# Patient Record
Sex: Female | Born: 1935 | Race: White | Hispanic: No | Smoking: Never smoker
Health system: Southern US, Community
[De-identification: ages and names within clinical notes are randomized; demographics above are authoritative.]

## PROBLEM LIST (undated history)

## (undated) DIAGNOSIS — I639 Cerebral infarction, unspecified: Secondary | ICD-10-CM

## (undated) DIAGNOSIS — I10 Essential (primary) hypertension: Secondary | ICD-10-CM

## (undated) DIAGNOSIS — M1990 Unspecified osteoarthritis, unspecified site: Secondary | ICD-10-CM

## (undated) DIAGNOSIS — I509 Heart failure, unspecified: Secondary | ICD-10-CM

## (undated) DIAGNOSIS — I519 Heart disease, unspecified: Secondary | ICD-10-CM

## (undated) DIAGNOSIS — C55 Malignant neoplasm of uterus, part unspecified: Secondary | ICD-10-CM

## (undated) DIAGNOSIS — E785 Hyperlipidemia, unspecified: Secondary | ICD-10-CM

## (undated) DIAGNOSIS — M549 Dorsalgia, unspecified: Secondary | ICD-10-CM

## (undated) DIAGNOSIS — N390 Urinary tract infection, site not specified: Secondary | ICD-10-CM

## (undated) HISTORY — DX: Urinary tract infection, site not specified: N39.0

## (undated) HISTORY — DX: Heart disease, unspecified: I51.9

## (undated) HISTORY — DX: Unspecified osteoarthritis, unspecified site: M19.90

## (undated) HISTORY — DX: Dorsalgia, unspecified: M54.9

## (undated) HISTORY — DX: Cerebral infarction, unspecified: I63.9

## (undated) HISTORY — DX: Malignant neoplasm of uterus, part unspecified: C55

## (undated) HISTORY — DX: Hyperlipidemia, unspecified: E78.5

## (undated) HISTORY — PX: TOTAL HIP ARTHROPLASTY: SHX124

## (undated) HISTORY — DX: Essential (primary) hypertension: I10

## (undated) HISTORY — DX: Heart failure, unspecified: I50.9

---
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
## 1970-04-26 HISTORY — PX: ABDOMINAL HYSTERECTOMY: SHX81

## 2004-05-25 ENCOUNTER — Ambulatory Visit: Payer: Self-pay | Admitting: Family Medicine

## 2005-07-16 ENCOUNTER — Ambulatory Visit: Payer: Self-pay | Admitting: Family Medicine

## 2006-07-20 ENCOUNTER — Ambulatory Visit: Payer: Self-pay | Admitting: Family Medicine

## 2007-06-20 ENCOUNTER — Ambulatory Visit: Payer: Self-pay | Admitting: Gastroenterology

## 2007-09-12 ENCOUNTER — Ambulatory Visit: Payer: Self-pay | Admitting: Family Medicine

## 2007-09-14 ENCOUNTER — Ambulatory Visit: Payer: Self-pay | Admitting: Family Medicine

## 2008-02-27 ENCOUNTER — Ambulatory Visit: Payer: Self-pay | Admitting: Ophthalmology

## 2008-03-11 ENCOUNTER — Ambulatory Visit: Payer: Self-pay | Admitting: Ophthalmology

## 2008-09-17 ENCOUNTER — Ambulatory Visit: Payer: Self-pay | Admitting: Family Medicine

## 2009-10-02 ENCOUNTER — Ambulatory Visit: Payer: Self-pay | Admitting: Family Medicine

## 2010-08-18 ENCOUNTER — Ambulatory Visit: Payer: Self-pay | Admitting: Ophthalmology

## 2010-08-31 ENCOUNTER — Ambulatory Visit: Payer: Self-pay | Admitting: Ophthalmology

## 2010-11-25 ENCOUNTER — Ambulatory Visit: Payer: Self-pay | Admitting: Family Medicine

## 2010-11-25 ENCOUNTER — Emergency Department: Payer: Self-pay | Admitting: Emergency Medicine

## 2010-12-16 ENCOUNTER — Ambulatory Visit: Payer: Self-pay | Admitting: Family Medicine

## 2010-12-23 ENCOUNTER — Encounter: Payer: Self-pay | Admitting: Nurse Practitioner

## 2010-12-23 ENCOUNTER — Encounter: Payer: Self-pay | Admitting: Cardiothoracic Surgery

## 2010-12-26 ENCOUNTER — Encounter: Payer: Self-pay | Admitting: Nurse Practitioner

## 2010-12-26 ENCOUNTER — Encounter: Payer: Self-pay | Admitting: Cardiothoracic Surgery

## 2011-01-25 ENCOUNTER — Encounter: Payer: Self-pay | Admitting: Cardiothoracic Surgery

## 2011-01-25 ENCOUNTER — Encounter: Payer: Self-pay | Admitting: Nurse Practitioner

## 2011-12-14 ENCOUNTER — Ambulatory Visit: Payer: Self-pay | Admitting: Family Medicine

## 2012-02-21 ENCOUNTER — Inpatient Hospital Stay: Payer: Self-pay | Admitting: Orthopedic Surgery

## 2012-02-21 LAB — BASIC METABOLIC PANEL
BUN: 21 mg/dL — ABNORMAL HIGH (ref 7–18)
Calcium, Total: 9.1 mg/dL (ref 8.5–10.1)
Chloride: 108 mmol/L — ABNORMAL HIGH (ref 98–107)
EGFR (Non-African Amer.): 51 — ABNORMAL LOW
Glucose: 116 mg/dL — ABNORMAL HIGH (ref 65–99)
Osmolality: 291 (ref 275–301)
Potassium: 4.1 mmol/L (ref 3.5–5.1)

## 2012-02-21 LAB — CBC
HCT: 31.9 % — ABNORMAL LOW (ref 35.0–47.0)
HGB: 10.5 g/dL — ABNORMAL LOW (ref 12.0–16.0)
MCH: 30 pg (ref 26.0–34.0)
MCHC: 33 g/dL (ref 32.0–36.0)
MCV: 91 fL (ref 80–100)
Platelet: 287 10*3/uL (ref 150–440)
RBC: 3.51 10*6/uL — ABNORMAL LOW (ref 3.80–5.20)
RDW: 14 % (ref 11.5–14.5)
WBC: 9 10*3/uL (ref 3.6–11.0)

## 2012-02-21 LAB — URINALYSIS, COMPLETE
Bilirubin,UR: NEGATIVE
Blood: NEGATIVE
Nitrite: NEGATIVE
Protein: NEGATIVE
RBC,UR: 3 /HPF (ref 0–5)
Specific Gravity: 1.017 (ref 1.003–1.030)
WBC UR: 52 /HPF (ref 0–5)

## 2012-02-21 LAB — APTT: Activated PTT: 29.2 secs (ref 23.6–35.9)

## 2012-02-21 LAB — IRON AND TIBC
Iron Bind.Cap.(Total): 297 ug/dL (ref 250–450)
Iron: 111 ug/dL (ref 50–170)
Unbound Iron-Bind.Cap.: 186 ug/dL

## 2012-02-21 LAB — FERRITIN: Ferritin (ARMC): 121 ng/mL (ref 8–388)

## 2012-02-21 LAB — PROTIME-INR
INR: 0.9
Prothrombin Time: 13 secs (ref 11.5–14.7)

## 2012-02-23 LAB — CBC WITH DIFFERENTIAL/PLATELET
Basophil #: 0 10*3/uL (ref 0.0–0.1)
Basophil %: 0.4 %
Eosinophil #: 0 10*3/uL (ref 0.0–0.7)
HGB: 7.7 g/dL — ABNORMAL LOW (ref 12.0–16.0)
Lymphocyte %: 10.1 %
MCHC: 35 g/dL (ref 32.0–36.0)
Monocyte %: 9 %
Neutrophil %: 80.5 %
RDW: 13.9 % (ref 11.5–14.5)

## 2012-02-23 LAB — BASIC METABOLIC PANEL
Calcium, Total: 7.9 mg/dL — ABNORMAL LOW (ref 8.5–10.1)
Chloride: 105 mmol/L (ref 98–107)
Co2: 26 mmol/L (ref 21–32)
Creatinine: 0.65 mg/dL (ref 0.60–1.30)
EGFR (African American): >60
Osmolality: 280 (ref 275–301)
Potassium: 3.7 mmol/L (ref 3.5–5.1)
Sodium: 139 mmol/L (ref 136–145)

## 2012-02-24 ENCOUNTER — Encounter: Payer: Self-pay | Admitting: Internal Medicine

## 2012-02-24 LAB — CBC WITH DIFFERENTIAL/PLATELET
Basophil #: 0.1 10*3/uL (ref 0.0–0.1)
Basophil #: 0.1 10*3/uL (ref 0.0–0.1)
Eosinophil #: 0.1 10*3/uL (ref 0.0–0.7)
Eosinophil %: 1.1 %
HCT: 24.1 % — ABNORMAL LOW (ref 35.0–47.0)
HGB: 6.7 g/dL — ABNORMAL LOW (ref 12.0–16.0)
Lymphocyte #: 1.7 10*3/uL (ref 1.0–3.6)
MCH: 29.4 pg (ref 26.0–34.0)
MCH: 31.3 pg (ref 26.0–34.0)
MCHC: 34.9 g/dL (ref 32.0–36.0)
MCV: 91 fL (ref 80–100)
MCV: 90 fL (ref 80–100)
Monocyte #: 1.1 x10 3/mm — ABNORMAL HIGH (ref 0.2–0.9)
Monocyte #: 1.1 x10 3/mm — ABNORMAL HIGH (ref 0.2–0.9)
Monocyte %: 13.4 %
Monocyte %: 11.2 %
Neutrophil #: 7.2 10*3/uL — ABNORMAL HIGH (ref 1.4–6.5)
Neutrophil %: 65.3 %
Platelet: 184 10*3/uL (ref 150–440)
Platelet: 196 10*3/uL (ref 150–440)
RBC: 2.26 10*6/uL — ABNORMAL LOW (ref 3.80–5.20)
RBC: 2.69 10*6/uL — ABNORMAL LOW (ref 3.80–5.20)
RDW: 14 % (ref 11.5–14.5)
WBC: 10 10*3/uL (ref 3.6–11.0)

## 2012-02-25 ENCOUNTER — Encounter: Payer: Self-pay | Admitting: Internal Medicine

## 2012-02-25 LAB — CBC WITH DIFFERENTIAL/PLATELET
Basophil #: 0.1 10*3/uL (ref 0.0–0.1)
Basophil %: 0.6 %
Eosinophil %: 2.2 %
HCT: 23.2 % — ABNORMAL LOW (ref 35.0–47.0)
HGB: 8.3 g/dL — ABNORMAL LOW (ref 12.0–16.0)
Lymphocyte %: 18.6 %
Monocyte %: 10.4 %
Neutrophil #: 6.3 10*3/uL (ref 1.4–6.5)
Neutrophil %: 68.2 %
RBC: 2.59 10*6/uL — ABNORMAL LOW (ref 3.80–5.20)
RDW: 13.8 % (ref 11.5–14.5)
WBC: 9.2 10*3/uL (ref 3.6–11.0)

## 2012-02-25 LAB — URINE CULTURE

## 2012-02-27 LAB — URINALYSIS, COMPLETE
Blood: NEGATIVE
Glucose,UR: NEGATIVE mg/dL (ref 0–75)
Ketone: NEGATIVE
Protein: NEGATIVE
RBC,UR: 1 /HPF (ref 0–5)
Specific Gravity: 1.006 (ref 1.003–1.030)
Squamous Epithelial: 1
Transitional Epi: 1
WBC UR: 12 /HPF (ref 0–5)

## 2012-03-06 ENCOUNTER — Ambulatory Visit: Payer: Self-pay | Admitting: Internal Medicine

## 2012-03-07 LAB — CBC WITH DIFFERENTIAL/PLATELET
Basophil %: 0.6 %
Eosinophil #: 0.2 10*3/uL (ref 0.0–0.7)
Eosinophil %: 2.9 %
HCT: 30.4 % — ABNORMAL LOW (ref 35.0–47.0)
HGB: 10 g/dL — ABNORMAL LOW (ref 12.0–16.0)
MCH: 30.5 pg (ref 26.0–34.0)
MCV: 93 fL (ref 80–100)
RBC: 3.29 10*6/uL — ABNORMAL LOW (ref 3.80–5.20)
WBC: 8.1 10*3/uL (ref 3.6–11.0)

## 2012-03-26 ENCOUNTER — Encounter: Payer: Self-pay | Admitting: Internal Medicine

## 2012-03-27 LAB — URINALYSIS, COMPLETE
Hyaline Cast: 1
Nitrite: POSITIVE
Protein: NEGATIVE
RBC,UR: 1 /HPF (ref 0–5)
Specific Gravity: 1.014 (ref 1.003–1.030)
WBC UR: 37 /HPF (ref 0–5)

## 2012-05-01 ENCOUNTER — Inpatient Hospital Stay: Payer: Self-pay | Admitting: Student

## 2012-05-01 LAB — BASIC METABOLIC PANEL
BUN: 15 mg/dL (ref 7–18)
Calcium, Total: 9.6 mg/dL (ref 8.5–10.1)
Chloride: 106 mmol/L (ref 98–107)
Creatinine: 0.71 mg/dL (ref 0.60–1.30)
EGFR (African American): >60
EGFR (Non-African Amer.): >60
Osmolality: 287 (ref 275–301)
Potassium: 3.7 mmol/L (ref 3.5–5.1)
Sodium: 144 mmol/L (ref 136–145)

## 2012-05-01 LAB — CBC
HCT: 33.2 % — ABNORMAL LOW (ref 35.0–47.0)
HGB: 10.8 g/dL — ABNORMAL LOW (ref 12.0–16.0)
MCH: 29.6 pg (ref 26.0–34.0)
RBC: 3.64 10*6/uL — ABNORMAL LOW (ref 3.80–5.20)
RDW: 15.3 % — ABNORMAL HIGH (ref 11.5–14.5)
WBC: 9.1 10*3/uL (ref 3.6–11.0)

## 2012-05-01 LAB — URINALYSIS, COMPLETE
Bacteria: NONE SEEN
Bilirubin,UR: NEGATIVE
Ketone: NEGATIVE
Ph: 7 (ref 4.5–8.0)
Protein: NEGATIVE
RBC,UR: NONE SEEN /HPF (ref 0–5)
Specific Gravity: 1.002 (ref 1.003–1.030)

## 2012-05-01 LAB — TROPONIN I
Troponin-I: 0.11 ng/mL — ABNORMAL HIGH
Troponin-I: 0.11 ng/mL — ABNORMAL HIGH

## 2012-05-01 LAB — CK TOTAL AND CKMB (NOT AT ARMC)
CK, Total: 67 U/L (ref 21–215)
CK-MB: 1 ng/mL (ref 0.5–3.6)

## 2012-05-02 LAB — CBC WITH DIFFERENTIAL/PLATELET
Basophil #: 0.1 10*3/uL (ref 0.0–0.1)
Basophil %: 1.1 %
Eosinophil #: 0.2 10*3/uL (ref 0.0–0.7)
HCT: 30.4 % — ABNORMAL LOW (ref 35.0–47.0)
Lymphocyte #: 2.3 10*3/uL (ref 1.0–3.6)
Lymphocyte %: 26.7 %
MCH: 29.4 pg (ref 26.0–34.0)
MCV: 90 fL (ref 80–100)
Monocyte #: 0.8 x10 3/mm (ref 0.2–0.9)
Monocyte %: 9.3 %
Neutrophil #: 5.1 10*3/uL (ref 1.4–6.5)
Neutrophil %: 60.5 %

## 2012-05-02 LAB — LIPID PANEL
HDL Cholesterol: 28 mg/dL — ABNORMAL LOW (ref 40–60)
Triglycerides: 106 mg/dL (ref 0–200)
VLDL Cholesterol, Calc: 21 mg/dL (ref 5–40)

## 2012-05-02 LAB — BASIC METABOLIC PANEL
Chloride: 106 mmol/L (ref 98–107)
Co2: 30 mmol/L (ref 21–32)
Creatinine: 0.82 mg/dL (ref 0.60–1.30)
EGFR (Non-African Amer.): >60
Glucose: 94 mg/dL (ref 65–99)
Potassium: 3.6 mmol/L (ref 3.5–5.1)

## 2012-05-02 LAB — MAGNESIUM: Magnesium: 2.1 mg/dL

## 2012-05-03 LAB — URINE CULTURE

## 2012-05-26 ENCOUNTER — Observation Stay: Payer: Self-pay | Admitting: Internal Medicine

## 2012-05-26 LAB — COMPREHENSIVE METABOLIC PANEL
Albumin: 4 g/dL (ref 3.4–5.0)
Alkaline Phosphatase: 76 U/L (ref 50–136)
Anion Gap: 7 (ref 7–16)
Co2: 27 mmol/L (ref 21–32)
Osmolality: 267 (ref 275–301)
Potassium: 4.4 mmol/L (ref 3.5–5.1)
Total Protein: 7.8 g/dL (ref 6.4–8.2)

## 2012-05-26 LAB — URINALYSIS, COMPLETE
Bacteria: NONE SEEN
Blood: NEGATIVE
Ketone: NEGATIVE
Nitrite: NEGATIVE
Specific Gravity: 1.005 (ref 1.003–1.030)
Squamous Epithelial: <1
WBC UR: 97 /HPF (ref 0–5)

## 2012-05-26 LAB — CBC
HGB: 11 g/dL — ABNORMAL LOW (ref 12.0–16.0)
MCH: 29.8 pg (ref 26.0–34.0)
MCHC: 33.6 g/dL (ref 32.0–36.0)
Platelet: 298 10*3/uL (ref 150–440)
RBC: 3.7 10*6/uL — ABNORMAL LOW (ref 3.80–5.20)
RDW: 14.4 % (ref 11.5–14.5)
WBC: 9.6 10*3/uL (ref 3.6–11.0)

## 2012-05-27 LAB — CBC WITH DIFFERENTIAL/PLATELET
Basophil #: 0.1 10*3/uL (ref 0.0–0.1)
Lymphocyte #: 1.4 10*3/uL (ref 1.0–3.6)
MCH: 30 pg (ref 26.0–34.0)
MCHC: 33.8 g/dL (ref 32.0–36.0)
MCV: 89 fL (ref 80–100)
Monocyte #: 0.6 x10 3/mm (ref 0.2–0.9)
Monocyte %: 10.3 %
Neutrophil %: 60.3 %
Platelet: 235 10*3/uL (ref 150–440)
RBC: 3.06 10*6/uL — ABNORMAL LOW (ref 3.80–5.20)
RDW: 14.3 % (ref 11.5–14.5)
WBC: 5.8 10*3/uL (ref 3.6–11.0)

## 2012-05-27 LAB — BASIC METABOLIC PANEL
Anion Gap: 7 (ref 7–16)
Chloride: 107 mmol/L (ref 98–107)
Co2: 25 mmol/L (ref 21–32)
Creatinine: 0.88 mg/dL (ref 0.60–1.30)
EGFR (Non-African Amer.): >60
Glucose: 93 mg/dL (ref 65–99)
Osmolality: 279 (ref 275–301)
Potassium: 4.1 mmol/L (ref 3.5–5.1)

## 2012-05-27 LAB — TROPONIN I: Troponin-I: 0.04 ng/mL

## 2012-06-01 ENCOUNTER — Observation Stay: Payer: Self-pay | Admitting: Internal Medicine

## 2012-06-01 LAB — COMPREHENSIVE METABOLIC PANEL
Albumin: 3.7 g/dL (ref 3.4–5.0)
Alkaline Phosphatase: 70 U/L (ref 50–136)
Anion Gap: 5 — ABNORMAL LOW (ref 7–16)
BUN: 15 mg/dL (ref 7–18)
Chloride: 100 mmol/L (ref 98–107)
Creatinine: 0.81 mg/dL (ref 0.60–1.30)
EGFR (Non-African Amer.): >60
SGOT(AST): 34 U/L (ref 15–37)

## 2012-06-01 LAB — CBC
HGB: 10.5 g/dL — ABNORMAL LOW (ref 12.0–16.0)
MCHC: 33.6 g/dL (ref 32.0–36.0)
Platelet: 279 10*3/uL (ref 150–440)
RBC: 3.49 10*6/uL — ABNORMAL LOW (ref 3.80–5.20)
RDW: 14.1 % (ref 11.5–14.5)

## 2012-06-01 LAB — CK TOTAL AND CKMB (NOT AT ARMC)
CK, Total: 70 U/L (ref 21–215)
CK, Total: 58 U/L (ref 21–215)
CK-MB: 1.2 ng/mL (ref 0.5–3.6)
CK-MB: <0.5 ng/mL — ABNORMAL LOW (ref 0.5–3.6)

## 2012-06-01 LAB — PROTIME-INR
INR: 0.9
Prothrombin Time: 12.6 secs (ref 11.5–14.7)

## 2012-06-01 LAB — TSH: Thyroid Stimulating Horm: 3.1 u[IU]/mL

## 2012-06-01 LAB — TROPONIN I: Troponin-I: 0.03 ng/mL

## 2012-06-02 LAB — CK TOTAL AND CKMB (NOT AT ARMC): CK, Total: 51 U/L (ref 21–215)

## 2012-06-02 LAB — BASIC METABOLIC PANEL
Anion Gap: 7 (ref 7–16)
Chloride: 102 mmol/L (ref 98–107)
Co2: 27 mmol/L (ref 21–32)
Creatinine: 0.98 mg/dL (ref 0.60–1.30)
EGFR (African American): >60
EGFR (Non-African Amer.): 53 — ABNORMAL LOW
Osmolality: 274 (ref 275–301)
Potassium: 4 mmol/L (ref 3.5–5.1)

## 2012-06-02 LAB — TROPONIN I: Troponin-I: 0.03 ng/mL

## 2012-07-05 DIAGNOSIS — I519 Heart disease, unspecified: Secondary | ICD-10-CM | POA: Insufficient documentation

## 2012-07-05 DIAGNOSIS — S72001A Fracture of unspecified part of neck of right femur, initial encounter for closed fracture: Secondary | ICD-10-CM | POA: Insufficient documentation

## 2012-07-05 DIAGNOSIS — Z9049 Acquired absence of other specified parts of digestive tract: Secondary | ICD-10-CM | POA: Insufficient documentation

## 2012-07-05 DIAGNOSIS — Z90710 Acquired absence of both cervix and uterus: Secondary | ICD-10-CM | POA: Insufficient documentation

## 2012-07-05 DIAGNOSIS — G939 Disorder of brain, unspecified: Secondary | ICD-10-CM | POA: Insufficient documentation

## 2012-07-05 DIAGNOSIS — M1990 Unspecified osteoarthritis, unspecified site: Secondary | ICD-10-CM | POA: Insufficient documentation

## 2012-08-18 DIAGNOSIS — N3020 Other chronic cystitis without hematuria: Secondary | ICD-10-CM | POA: Insufficient documentation

## 2012-09-12 DIAGNOSIS — I10 Essential (primary) hypertension: Secondary | ICD-10-CM | POA: Insufficient documentation

## 2012-09-12 DIAGNOSIS — R55 Syncope and collapse: Secondary | ICD-10-CM | POA: Insufficient documentation

## 2012-09-22 ENCOUNTER — Emergency Department: Payer: Self-pay | Admitting: Emergency Medicine

## 2012-09-24 DIAGNOSIS — R339 Retention of urine, unspecified: Secondary | ICD-10-CM | POA: Insufficient documentation

## 2012-09-24 DIAGNOSIS — N390 Urinary tract infection, site not specified: Secondary | ICD-10-CM | POA: Insufficient documentation

## 2012-09-24 DIAGNOSIS — C539 Malignant neoplasm of cervix uteri, unspecified: Secondary | ICD-10-CM | POA: Insufficient documentation

## 2012-10-02 DIAGNOSIS — Z87440 Personal history of urinary (tract) infections: Secondary | ICD-10-CM | POA: Insufficient documentation

## 2012-10-02 DIAGNOSIS — K5710 Diverticulosis of small intestine without perforation or abscess without bleeding: Secondary | ICD-10-CM | POA: Insufficient documentation

## 2012-12-22 ENCOUNTER — Emergency Department: Payer: Self-pay | Admitting: Emergency Medicine

## 2012-12-22 LAB — COMPREHENSIVE METABOLIC PANEL
Alkaline Phosphatase: 77 U/L (ref 50–136)
Anion Gap: 7 (ref 7–16)
BUN: 16 mg/dL (ref 7–18)
Chloride: 91 mmol/L — ABNORMAL LOW (ref 98–107)
Co2: 26 mmol/L (ref 21–32)
Creatinine: 0.91 mg/dL (ref 0.60–1.30)
Glucose: 126 mg/dL — ABNORMAL HIGH (ref 65–99)
Osmolality: 252 (ref 275–301)
Potassium: 4.1 mmol/L (ref 3.5–5.1)
SGOT(AST): 28 U/L (ref 15–37)
SGPT (ALT): 22 U/L (ref 12–78)
Sodium: 124 mmol/L — ABNORMAL LOW (ref 136–145)

## 2012-12-22 LAB — URINALYSIS, COMPLETE
Bacteria: NONE SEEN
Glucose,UR: NEGATIVE mg/dL (ref 0–75)
Ketone: NEGATIVE
Nitrite: NEGATIVE
Ph: 6 (ref 4.5–8.0)
Protein: NEGATIVE

## 2012-12-22 LAB — CBC
HCT: 31.6 % — ABNORMAL LOW (ref 35.0–47.0)
MCV: 88 fL (ref 80–100)
WBC: 7.1 10*3/uL (ref 3.6–11.0)

## 2013-01-11 ENCOUNTER — Emergency Department: Payer: Self-pay | Admitting: Emergency Medicine

## 2013-01-11 LAB — COMPREHENSIVE METABOLIC PANEL
Alkaline Phosphatase: 80 U/L (ref 50–136)
BUN: 22 mg/dL — ABNORMAL HIGH (ref 7–18)
Calcium, Total: 9.7 mg/dL (ref 8.5–10.1)
Chloride: 100 mmol/L (ref 98–107)
Creatinine: 0.92 mg/dL (ref 0.60–1.30)
EGFR (Non-African Amer.): 57 — ABNORMAL LOW
Osmolality: 270 (ref 275–301)
SGPT (ALT): 24 U/L (ref 12–78)
Total Protein: 8.1 g/dL (ref 6.4–8.2)

## 2013-01-11 LAB — CBC
HCT: 31.8 % — ABNORMAL LOW (ref 35.0–47.0)
HGB: 10.8 g/dL — ABNORMAL LOW (ref 12.0–16.0)
MCH: 30.2 pg (ref 26.0–34.0)
MCV: 89 fL (ref 80–100)
RBC: 3.58 10*6/uL — ABNORMAL LOW (ref 3.80–5.20)
WBC: 8.1 10*3/uL (ref 3.6–11.0)

## 2013-01-11 LAB — URINALYSIS, COMPLETE
Blood: NEGATIVE
Glucose,UR: NEGATIVE mg/dL (ref 0–75)
Nitrite: NEGATIVE
Ph: 7 (ref 4.5–8.0)
Specific Gravity: 1.005 (ref 1.003–1.030)
WBC UR: 6 /HPF (ref 0–5)

## 2013-02-05 ENCOUNTER — Ambulatory Visit: Payer: Self-pay | Admitting: Family Medicine

## 2013-09-06 ENCOUNTER — Emergency Department: Payer: Self-pay | Admitting: Emergency Medicine

## 2013-09-06 LAB — BASIC METABOLIC PANEL
Anion Gap: 6 — ABNORMAL LOW (ref 7–16)
BUN: 18 mg/dL (ref 7–18)
CHLORIDE: 97 mmol/L — AB (ref 98–107)
Calcium, Total: 9.8 mg/dL (ref 8.5–10.1)
Co2: 29 mmol/L (ref 21–32)
Creatinine: 0.73 mg/dL (ref 0.60–1.30)
EGFR (African American): >60
EGFR (Non-African Amer.): >60
Glucose: 98 mg/dL (ref 65–99)
OSMOLALITY: 266 (ref 275–301)
Potassium: 4.1 mmol/L (ref 3.5–5.1)
Sodium: 132 mmol/L — ABNORMAL LOW (ref 136–145)

## 2013-09-06 LAB — CBC
HCT: 33.7 % — ABNORMAL LOW (ref 35.0–47.0)
HGB: 11.1 g/dL — ABNORMAL LOW (ref 12.0–16.0)
MCH: 30.2 pg (ref 26.0–34.0)
MCHC: 32.9 g/dL (ref 32.0–36.0)
MCV: 92 fL (ref 80–100)
Platelet: 334 10*3/uL (ref 150–440)
RBC: 3.67 10*6/uL — ABNORMAL LOW (ref 3.80–5.20)
RDW: 13.6 % (ref 11.5–14.5)
WBC: 6.7 10*3/uL (ref 3.6–11.0)

## 2013-09-06 LAB — TROPONIN I
TROPONIN-I: 0.04 ng/mL
Troponin-I: 0.04 ng/mL

## 2013-09-06 LAB — PRO B NATRIURETIC PEPTIDE: B-Type Natriuretic Peptide: 366 pg/mL (ref 0–450)

## 2013-11-01 DIAGNOSIS — R2681 Unsteadiness on feet: Secondary | ICD-10-CM | POA: Insufficient documentation

## 2013-12-06 DIAGNOSIS — R262 Difficulty in walking, not elsewhere classified: Secondary | ICD-10-CM | POA: Insufficient documentation

## 2013-12-06 DIAGNOSIS — R2689 Other abnormalities of gait and mobility: Secondary | ICD-10-CM | POA: Insufficient documentation

## 2013-12-06 DIAGNOSIS — G723 Periodic paralysis: Secondary | ICD-10-CM | POA: Insufficient documentation

## 2013-12-17 ENCOUNTER — Ambulatory Visit: Payer: Self-pay | Admitting: Neurology

## 2013-12-24 DIAGNOSIS — I5032 Chronic diastolic (congestive) heart failure: Secondary | ICD-10-CM | POA: Insufficient documentation

## 2013-12-27 DIAGNOSIS — D352 Benign neoplasm of pituitary gland: Secondary | ICD-10-CM | POA: Insufficient documentation

## 2014-03-13 ENCOUNTER — Ambulatory Visit: Payer: Self-pay | Admitting: Family Medicine

## 2014-03-13 DIAGNOSIS — R410 Disorientation, unspecified: Secondary | ICD-10-CM | POA: Insufficient documentation

## 2014-03-20 ENCOUNTER — Ambulatory Visit: Payer: Self-pay | Admitting: Family Medicine

## 2014-03-22 IMAGING — US US CAROTID DUPLEX BILAT
1 series · 13 of 24 positions shown · non-contrast
Comparison: None

REASON FOR EXAM: TIA
COMMENTS:

PROCEDURE:     US  - US CAROTID DOPPLER BILATERAL  - May 26, 2012  [DATE]
RESULT:     Indication: TIA
TECHNIQUE: Gray-scale, color Doppler, and spectral Doppler images were
obtained of the extracranial carotid artery systems and vertebral arteries
in the neck.

[Series 1: us carotid duplex bilat · 0.07mm/px · 13 of 63 slices shown]
[im 1/63]
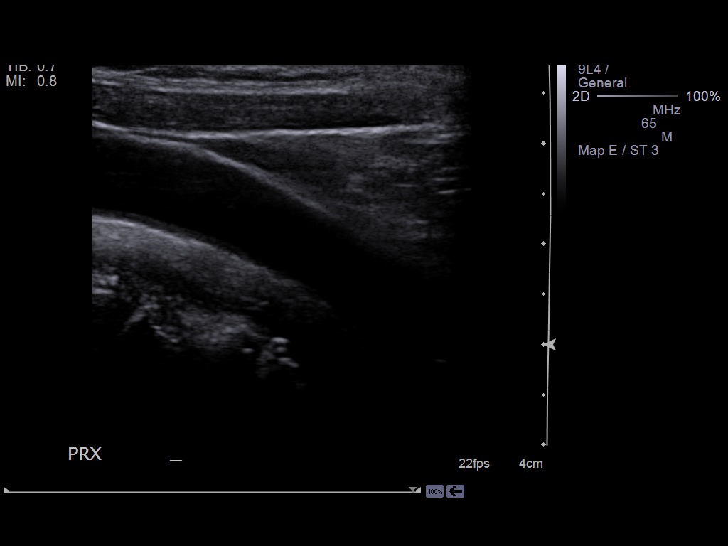
[im 6/63]
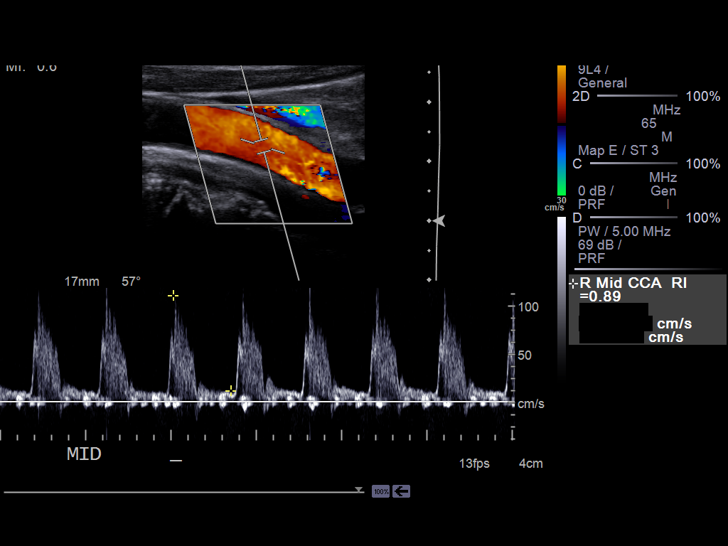
[im 11/63]
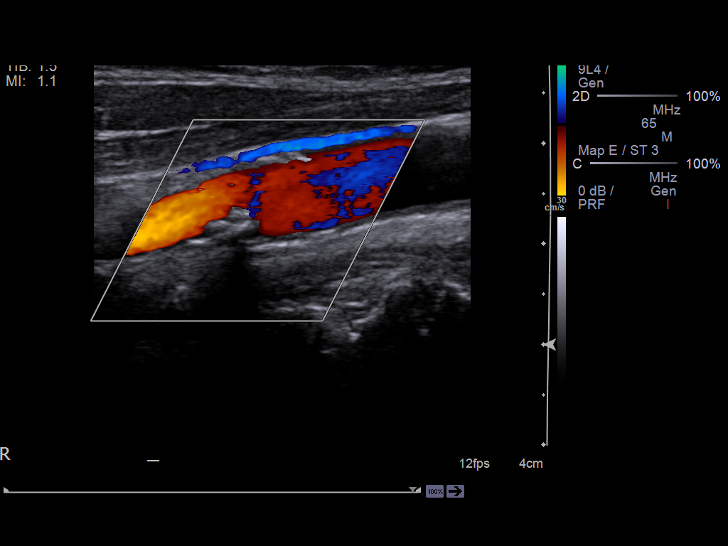
[im 17/63]
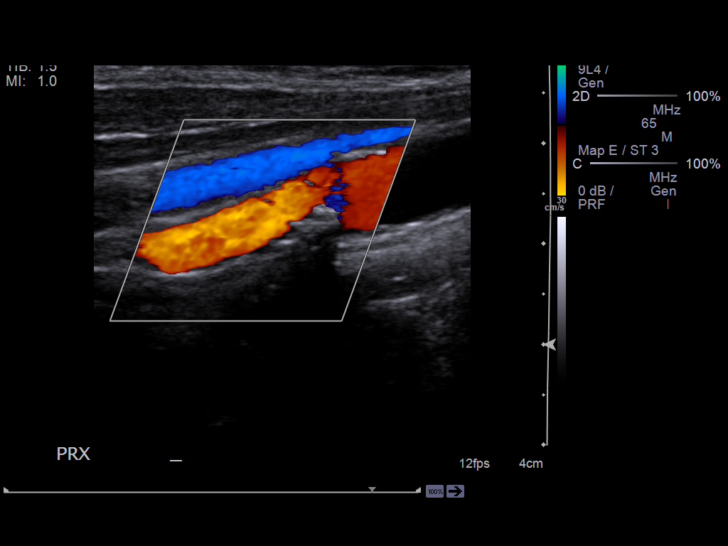
[im 22/63]
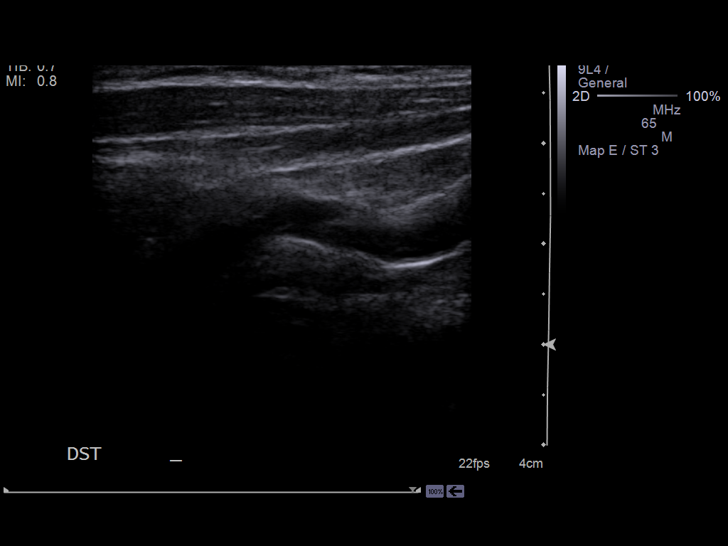
[im 27/63]
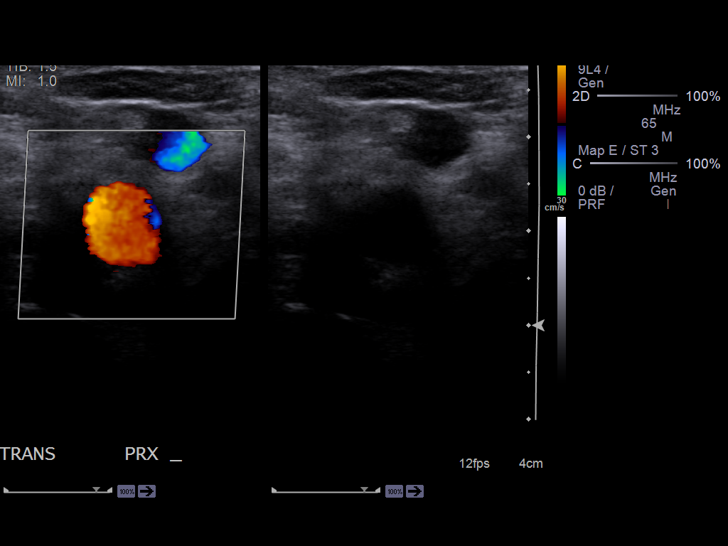
[im 33/63]
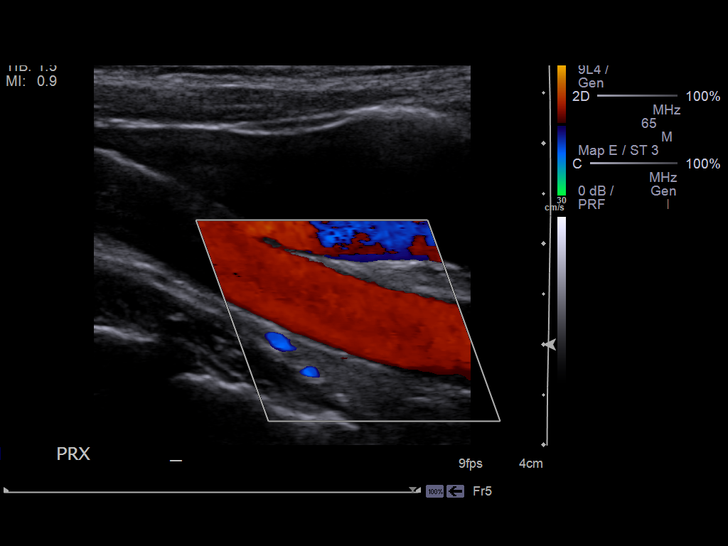
[im 36/63]
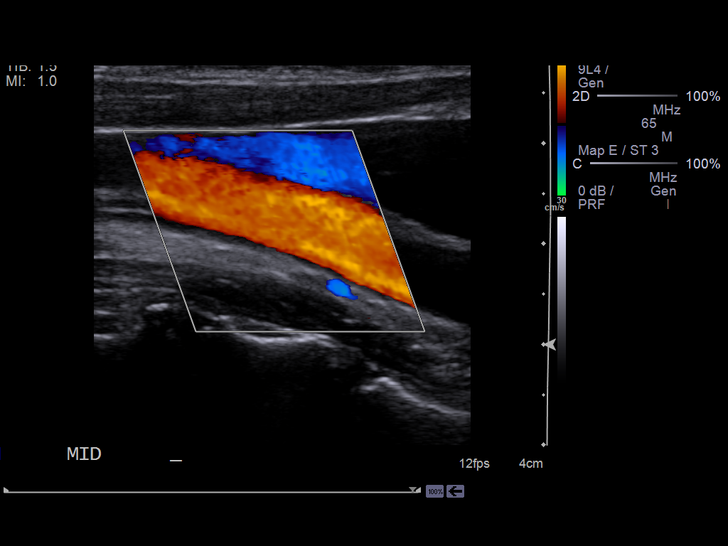
[im 41/63]
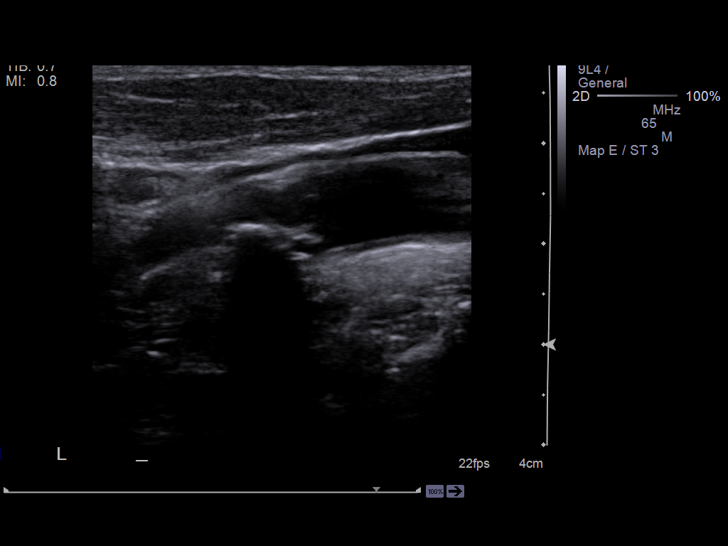
[im 46/63]
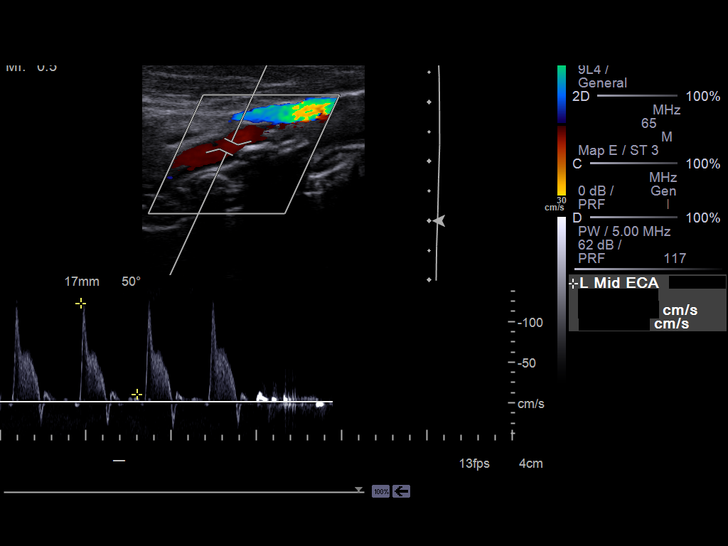
[im 52/63]
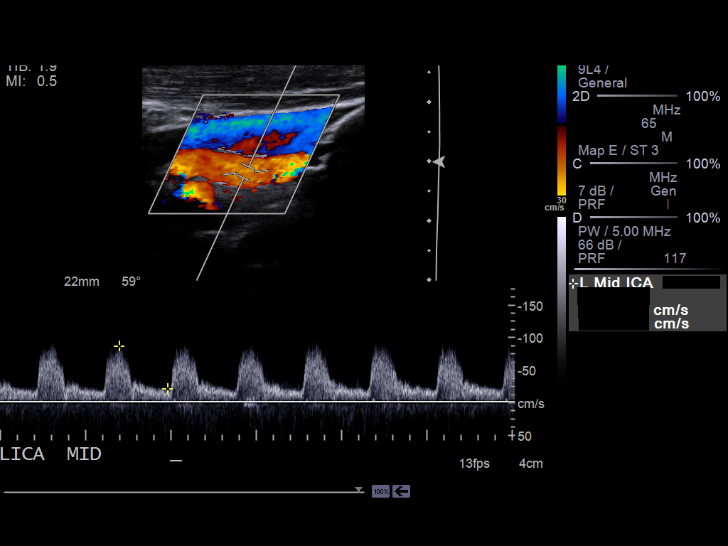
[im 57/63]
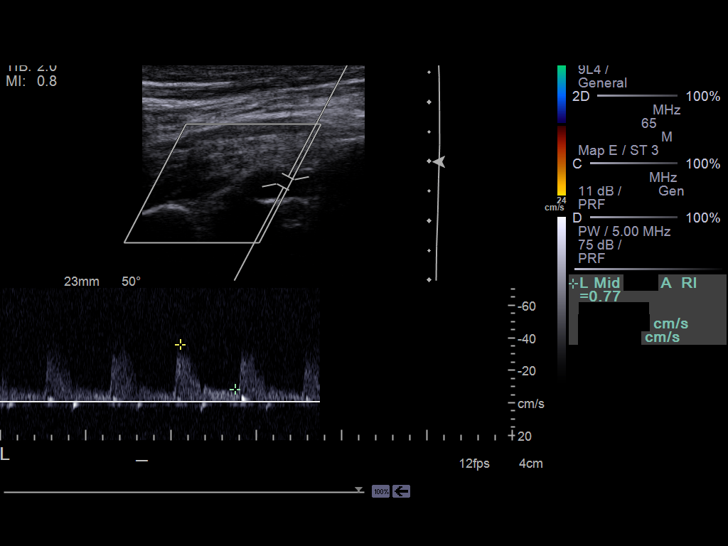
[im 63/63]
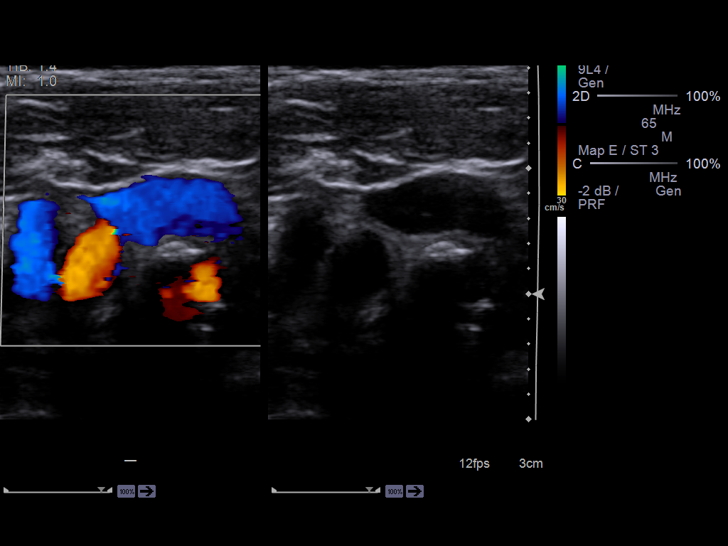

[13 of 24 positions shown; findings below may reference images not displayed]

FINDINGS: On the right, there is complex atherosclerotic plaque within the carotid
bulb and proximal ICA. Maximum peak systolic velocity in the right CCA is
111 cm/second. Maximum peak systolic velocity in the right ICA is 114
cm/second. Maximum peak systolic velocity in the right ECA is 120 cm/second.
The right ICA/CCA ratio is 1.02. This corresponds to a stenosis of less than
50 %. Antegrade blood flow is documented in the right vertebral artery.

On the left, there is complex atherosclerotic plaque within the carotid bulb
and proximal ICA. Maximum peak systolic velocity in the left CCA is 87
cm/second. Maximum peak systolic velocity in the left ICA is 98 cm/second.
Maximum peak systolic velocity in the left ECA is 122 cm/second. The left
ICA/CCA ratio is 1.12. This corresponds to a stenosis of less than 50 %.
Antegrade blood flow is documented in the left vertebral artery.
IMPRESSION: 1. No hemodynamically significant carotid artery stenosis.

[REDACTED]

## 2014-03-22 IMAGING — CR DG CHEST 1V PORT
1 series · 1 of 1 positions shown · non-contrast
Comparison: none

REASON FOR EXAM: CVA
COMMENTS:

[ap]
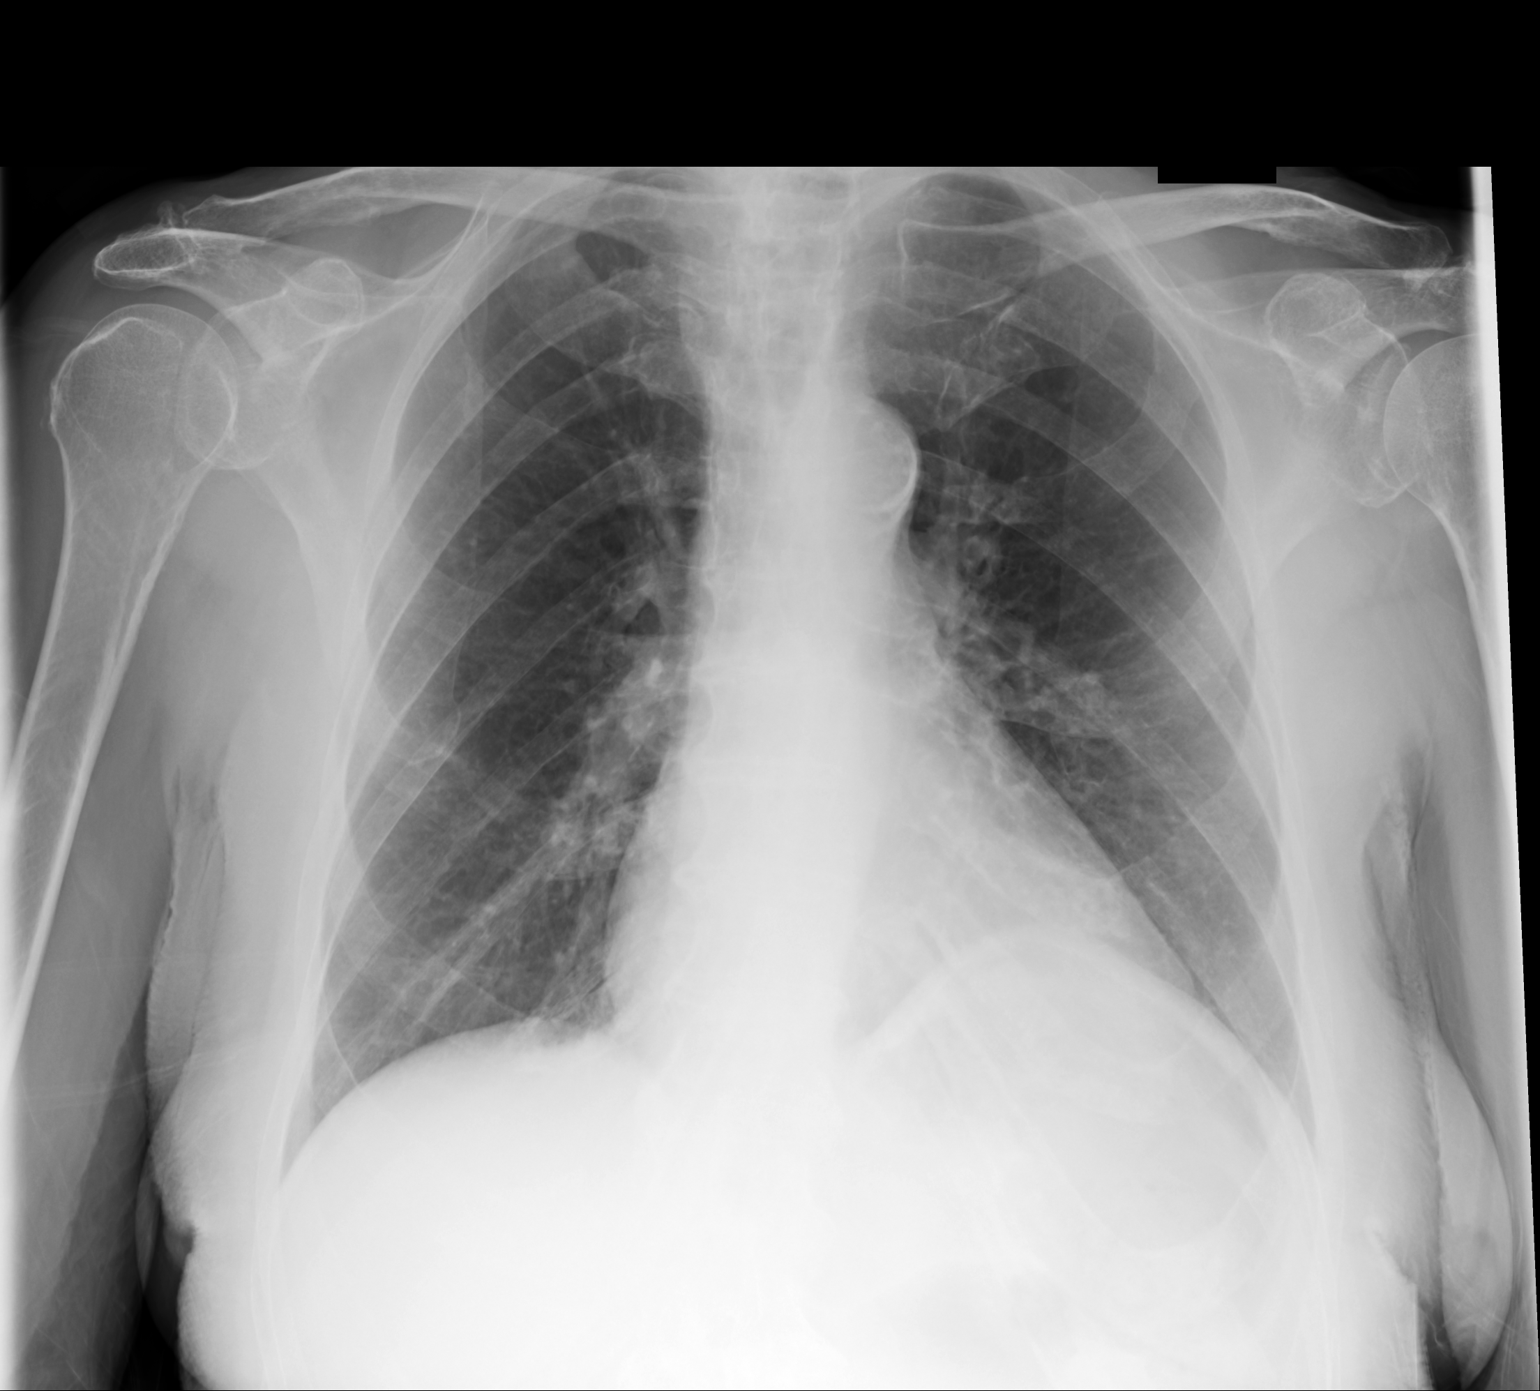

[1 of 1 positions shown; findings below may reference images not displayed]

PROCEDURE:     DXR - DXR PORTABLE CHEST SINGLE VIEW  - May 26, 2012  [DATE]

RESULT:     Comparison is made to study May 01, 2012.

The lungs are well-expanded. The cardiac silhouette is top normal in size.
The pulmonary vascularity is not engorged. There is no pleural effusion. The
mediastinum is normal in width. The bony thorax exhibits no acute
abnormality.
IMPRESSION: There is no evidence of acute cardiopulmonary abnormality.

[REDACTED]

## 2014-03-22 IMAGING — CT CT HEAD WITHOUT CONTRAST
1 series · 15 of 30 positions shown, 19 images · non-contrast
Comparison: none

REASON FOR EXAM: ams
COMMENTS:

[Series 2: soft tissue · axial · 0.45mm/px · z∈[-159,-19]mm · 15 of 32 slices shown, 19 images]
[im 2/32  brain]
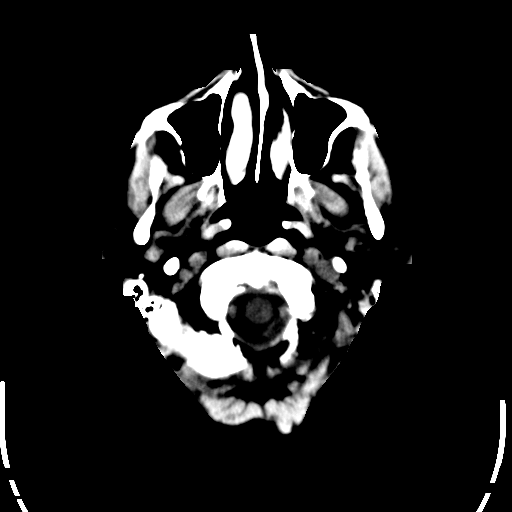
[im 2/32  bone]
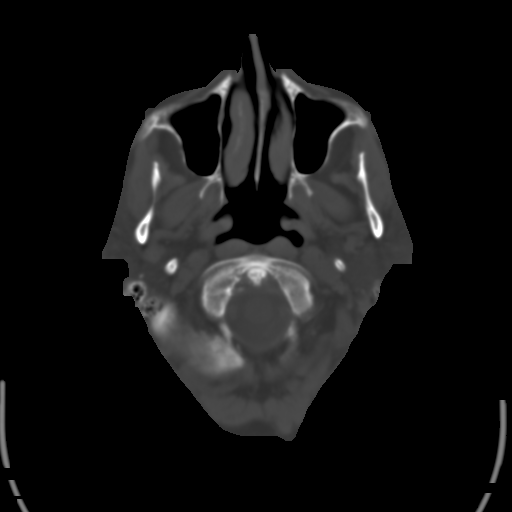
[im 4/32  brain]
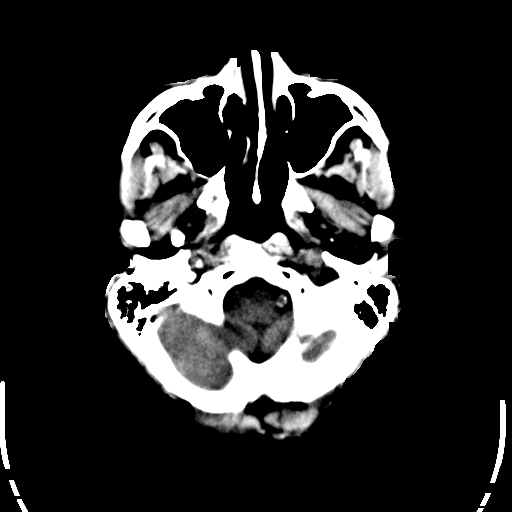
[im 6/32  brain]
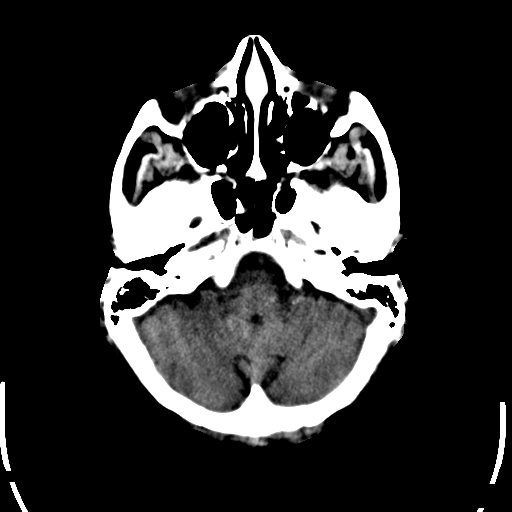
[im 8/32  brain]
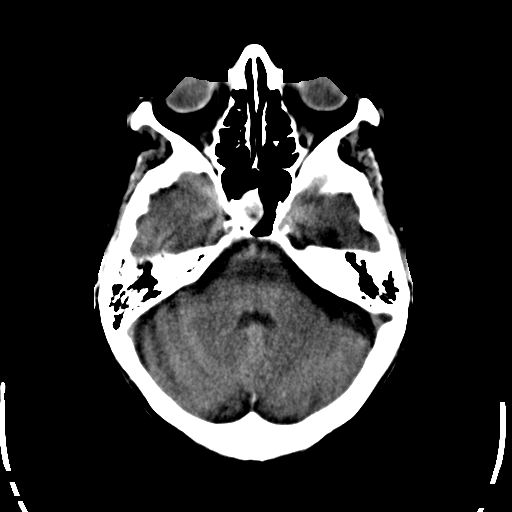
[im 10/32  brain]
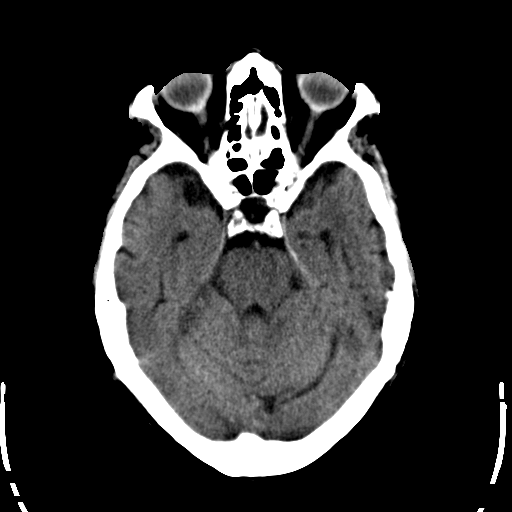
[im 10/32  bone]
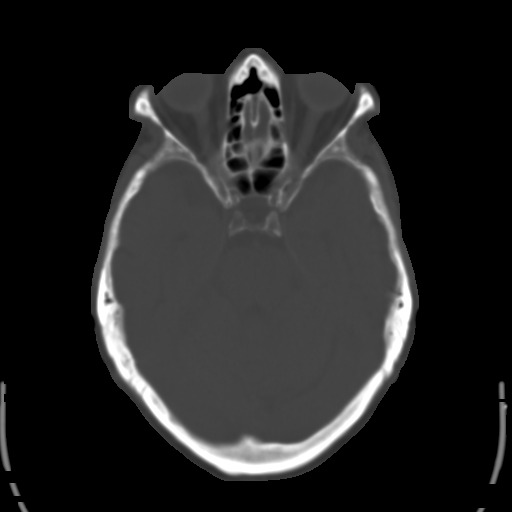
[im 12/32  brain]
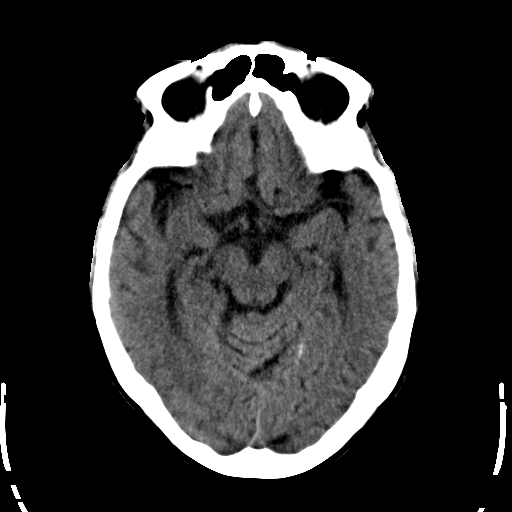
[im 14/32  brain]
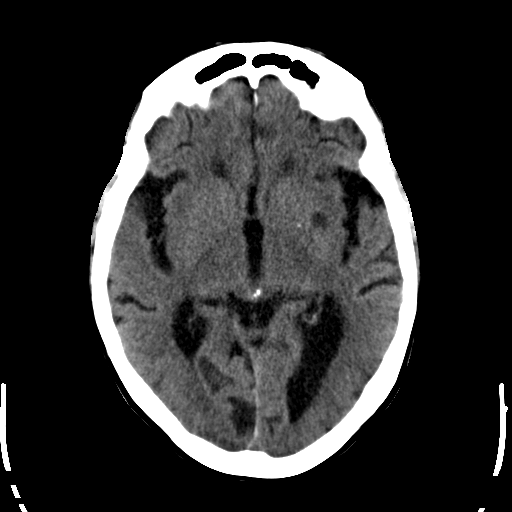
[im 17/32  brain]
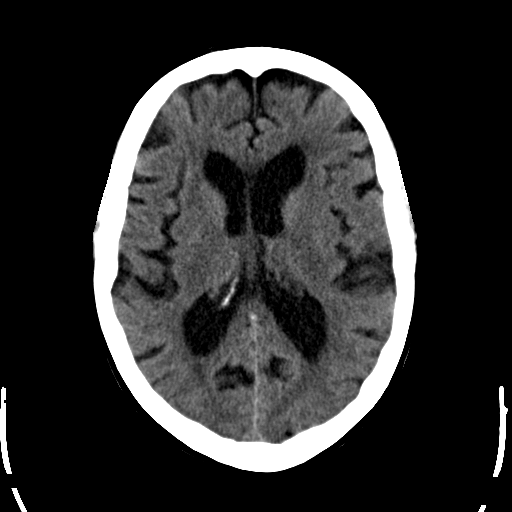
[im 18/32  brain]
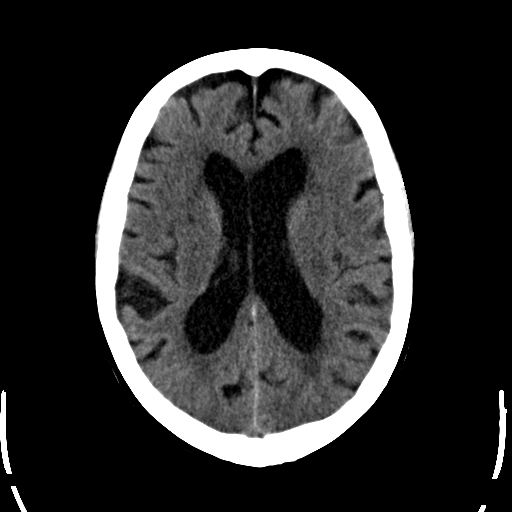
[im 18/32  bone]
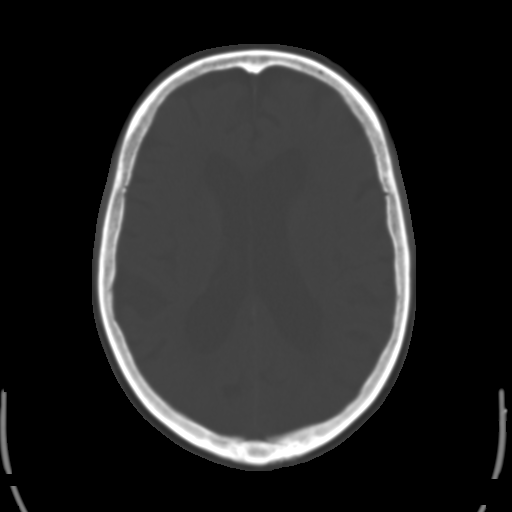
[im 20/32  brain]
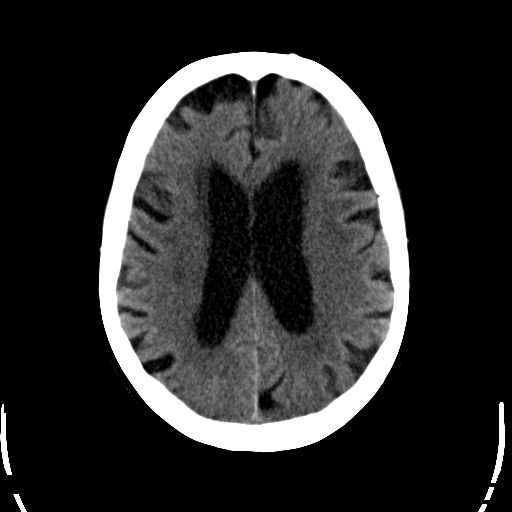
[im 22/32  brain]
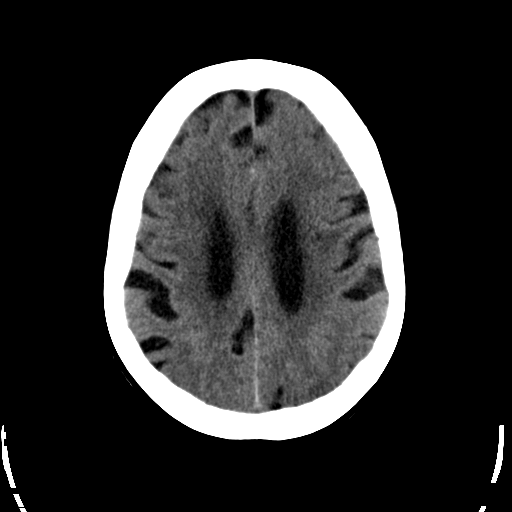
[im 24/32  brain]
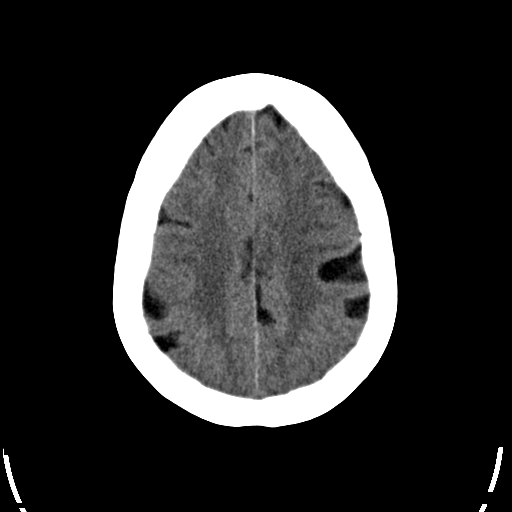
[im 26/32  brain]
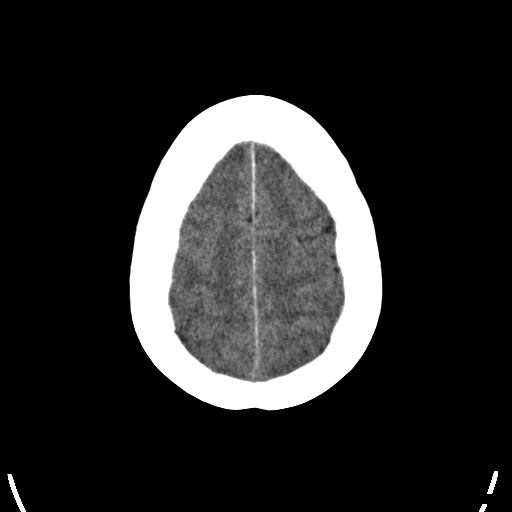
[im 26/32  bone]
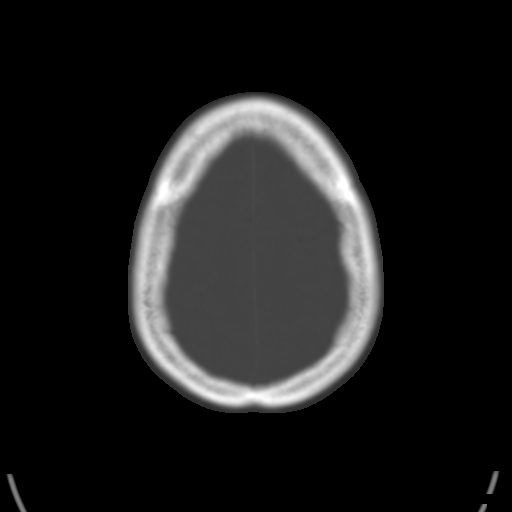
[im 28/32  brain]
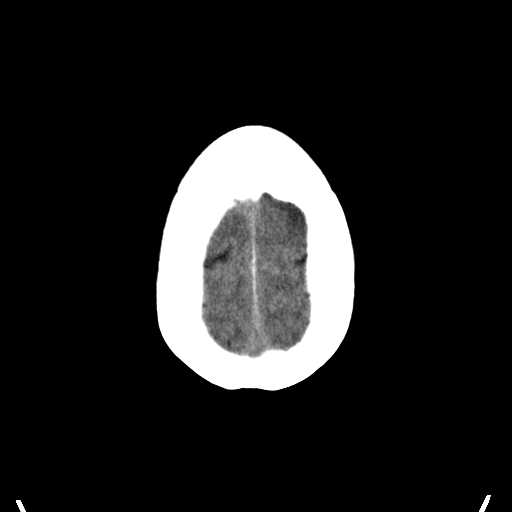
[im 30/32  brain]
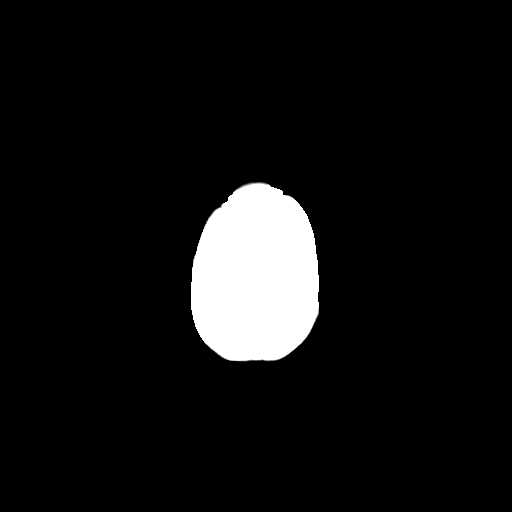

[15 of 30 positions shown; findings below may reference images not displayed]

PROCEDURE:     CT  - CT HEAD WITHOUT CONTRAST  - May 26, 2012  [DATE]

RESULT:     Noncontrast CT of the brain is compared to 01 May, 2012.

There is prominence of the ventricles and sulci. There is low-attenuation in
the periventricular and subcortical white matter. There is no evidence of
intracranial hemorrhage, mass effect or midline shift. No evolving infarct
is evident. The included mastoid air cells and paranasal sinuses show normal
appearing aeration except for a persistent area of soft tissue density along
the bony septum between the left and right sphenoid sinus which has a soft
tissue attenuation and measures up to approximately 1.75 cm. The orbits
appear within normal limits. No depressed skull fracture or destructive or
sclerotic bony masses appreciated. MRI followup may be beneficial if there
is concern for acute ischemic event. Stable low-attenuation in the basal
ganglia region consistent with lacunar infarcts.
IMPRESSION: 1. Atrophy with chronic microvascular ischemic disease. No acute
intracranial abnormality evident. Stable density in the sphenoid sinus
region as described.

[REDACTED]

## 2014-04-26 HISTORY — PX: SKIN CANCER DESTRUCTION: SHX778

## 2014-04-26 HISTORY — PX: BACK SURGERY: SHX140

## 2014-05-15 DIAGNOSIS — I10 Essential (primary) hypertension: Secondary | ICD-10-CM | POA: Insufficient documentation

## 2014-05-15 DIAGNOSIS — E785 Hyperlipidemia, unspecified: Secondary | ICD-10-CM | POA: Insufficient documentation

## 2014-07-04 DIAGNOSIS — N390 Urinary tract infection, site not specified: Secondary | ICD-10-CM | POA: Insufficient documentation

## 2014-08-13 NOTE — H&P (Signed)
    Subjective/Chief Complaint Right hip pain after a fall    History of Present Illness Patient is an 81 year old woman with history of a fall   Past Med/Surgical Hx:  Cervical cancer:   Hypertension:   Hard of Hearing:   Appendectomy:   Cataract Extraction:   Hysterectomy:   ALLERGIES:  No Known Allergies:   Family and Social History:   Family History Non-Contributory     Assessment/Admission Diagnosis Right hip basicervical fracture    Plan Intertrochanteric nail fixation of the hip tomorrow. NPO after midnight.   Electronic Signatures for Addendum Section:  Dawayne Patricia (MD) (Signed Addendum 28-Oct-13 17:10)  Full H&P dictated.   Electronic Signatures: Dawayne Patricia (MD)  (Signed 28-Oct-13 15:51)  Authored: CHIEF COMPLAINT and HISTORY, PAST MEDICAL/SURGIAL HISTORY, ALLERGIES, HOME MEDICATIONS, FAMILY AND SOCIAL HISTORY, ASSESSMENT AND PLAN   Last Updated: 28-Oct-13 17:10 by Dawayne Patricia (MD)
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
## 2014-08-13 NOTE — Op Note (Signed)
PATIENT NAME:  BRELYN, WOEHL MR#:  [id number] DATE OF BIRTH:  06/22/1926  DATE OF PROCEDURE:  02/22/2012  ADDENDUM:  Please note that the patient's right lower extremity was marked in the preoperative holding area.  Consent was reviewed and questions were answered.  A surgical time-out confirming surgical site, extremity, and procedure as well as confirming that the patient's radiographs were visible was performed prior to incision.   ____________________________ Dawayne Patricia, MD sr:bjt D: 02/25/2012 14:54:41 ET T: 02/25/2012 15:53:58 ET JOB#: [id number]  cc: Dawayne Patricia, MD, <Dictator> Dawayne Patricia MD ELECTRONICALLY SIGNED 02/28/2012 15:38

## 2014-08-13 NOTE — H&P (Signed)
PATIENT NAME:  Christine Mclean, Christine Mclean MR#:  [id number] DATE OF BIRTH:  31-Jul-1926  DATE OF ADMISSION:  02/21/2012  CHIEF COMPLAINT: Pain in the right hip after a fall.  HISTORY OF PRESENT ILLNESS: Christine Mclean is an 81 year old female who lives independently at home. She was doing some laundry and slipped in her laundry room.  She states she did not lose consciousness. She feels that she slipped because she was wearing socks without shoes.    The patient does not use any assistive device for ambulation on a normal basis. She notes significant pain in her right hip.  She denies numbness or tingling. She denies other injury.   PAST MEDICAL HISTORY / PAST SURGICAL HISTORY:  1. Cervical cancer. 2. Hypertension. 3. Hearing deficit. 4. Appendectomy. 5. Cataract extraction. 6. Hysterectomy.   DRUG ALLERGIES: No known drug allergies.   MEDICATIONS:  1. Simvastatin. 2. Lisinopril. 3. Ibuprofen.  FAMILY HISTORY: Noncontributory.  REVIEW OF SYSTEMS: A 12-point review of systems was done. Negative except as noted in the History of Present Illness.      PHYSICAL EXAMINATION:  GENERAL: Awake, alert, and oriented x3. In no acute distress. In significant distress with any motion of her bed.   HEENT: Pupils equal and reactive to light. Trachea is midline. Partial hearing loss.   CARDIOVASCULAR: Regular rate and rhythm. No murmurs or bruits appreciated.   PULMONARY: Lungs are clear to auscultation bilaterally. There is slight decrease in breath sounds at bilateral lung bases.  ABDOMEN: Soft, nontender, nondistended. Normal bowel sounds.   EXTREMITIES: Right lower extremity was examined. Extremity is approximately 1.5 cm shortened and externally rotated. Foot is cool to touch and equal in temperature to the opposite side. DP and PT pulses are 1+.  Capillary refill is approximately 3 seconds. TA, GS, and EHL are 5 out of 5. Sensation of DPN, SPN, and tibial nerves are intact. There is two small  superficial skin lesions on the knee and tibia.   IMAGING STUDIES: Radiographs of the right hip were reviewed. They demonstrate a basicervical hip fracture.   ASSESSMENT: Right basicervical hip fracture.   PLAN: The patient will undergo medical clearance for surgery tomorrow. She will be NPO after midnight. She will undergo fracture fixation with an intramedullary nail.   The risks and benefits of surgery were explained to the patient and her daughter in detail. We did explain that the risk of mortality within the first year after a hip fracture. We explained surgical risks. We also explained that the patient should expect some functional decrease after sustaining this injury.  They demonstrated full understanding. We will proceed with surgery as scheduled tomorrow.  ____________________________ Dawayne Patricia, MD sr:slb D: 02/21/2012 17:19:15 ET T: 02/21/2012 17:32:17 ET JOB#: [id number]  cc: Dawayne Patricia, MD, <Dictator> Dawayne Patricia MD ELECTRONICALLY SIGNED 02/28/2012 15:37

## 2014-08-13 NOTE — Consult Note (Signed)
Brief Consult Note: Diagnosis: ulcer of leg.   Comments: patient in the OR for hip surgery  will evaluate tomorrow.  Electronic Signatures: Hortencia Pilar (MD)  (Signed 29-Oct-13 17:52)  Authored: Brief Consult Note   Last Updated: 29-Oct-13 17:52 by Hortencia Pilar (MD)

## 2014-08-13 NOTE — Op Note (Signed)
PATIENT NAME:  Christine Mclean, Christine Mclean MR#:  [id number] DATE OF BIRTH:  11-01-1926  DATE OF PROCEDURE:  02/22/2012  PREOPERATIVE DIAGNOSIS:  Basocervical/intertrochanteric fracture of the right hip.  POSTOPERATIVE DIAGNOSIS:  Basocervical/intertrochanteric fracture of the right hip.  PROCEDURE:  Intertrochanteric nail, right hip.  SURGEON:  Dawayne Patricia, MD  ASSISTANT:  None.  ESTIMATED BLOOD LOSS:  150 mL.  URINE OUTPUT:  150 mL.  IMPLANTS USED:  [employer] Gamma system nail 380 x 11 mm, 100-mm hip screw, 45-mm distal interlock.  COMPLICATIONS:  No acute intraoperative or postoperative complications were noted.  ANESTHESIA:  Spinal anesthesia was administered along with sedation.  DISPOSITION:  The patient was stable.  She was neurovascularly intact.  She was transferred to the postoperative care unit.  INDICATIONS FOR THIS PROCEDURE:  Christine Mclean is an 81 year old female who presents after falling.  She was accompanied to the hospital by her daughter.  She sustained a basocervical fracture of the right hip with extension into the greater trochanteric region.  The patient was advised that this fracture would require surgical fixation.  The decision was made to proceed with a long intertrochanteric nail.  All of the risks and benefits of the surgery were explained to the patient and her daughter.  Consent was obtained for both surgery and blood transfusion.  DESCRIPTION OF PROCEDURE:  Christine Mclean was brought into the operating room.  Spinal anesthesia was performed.  The patient was then transferred to the fracture table where her left lower extremity was placed in a well legholder with adequate padding. Her right lower extremity was placed in traction.  Using fluoroscopic guidance, the hip fracture was reduced in excellent anatomic alignment.  The right lower extremity was prepared and draped in the usual sterile fashion.  Using fluoroscopic guidance, the tip of the greater trochanter  and the shaft of the femur were marked.  An incision was made.  Sharp dissection was carried down through the fascia.  Blunt dissection was carried down until the tip of the greater trochanter was easily palpated.    A guidepin was inserted into the tip of the greater trochanter under fluoroscopic guidance.  Confirmation of placement of this pin was confirmed on orthogonal views. An opening reamer was used to open the trochanter.  A ball-tipped guidewire was then placed into the canal under fluoroscopic guidance, confirming placement.  The femoral canal was then sequentially reamed to a width of 13 mm.  Prior to reaming, the ball-tipped guidewire depth was measured and found to be 390 mm.  A 380-mm x 11-mm Gamma nail was selected.  The nail was carefully inserted into the shaft of the femur, confirming maintenance of reduction of the fracture.  Fluoroscopic guidance was used to ensure that the nail was properly seated and that the provided hole for the hip screw fixation would be at an adequate height.  A guidepin was then inserted through the drill sleeve into the femoral neck.  Placement of the pin was confirmed on AP and lateral radiographs.  Care was taken to ensure that the pin did not penetrate the chondral surface.  The pin was then measured to a length of 96 mm.  The pin was overdrilled.  On final measurement the decision was made to place a 100-mm screw to ensure adequate fixation.   A 100-mm screw was inserted into the hip.  Excellent purchase was achieved. The hip screw was locked with set screw to secure a fixed angle.  At this time attention was  turned to the distal femur.  Using perfect circle technique, a 45-mm interlocking screw was placed.  Confirmation of placement of the screw was confirmed on orthogonal radiographs.  All wounds were quite copiously irrigated.  The iliotibial band for the hip screw and distal femoral screw was closed.  The fascial layer for the trochanteric insertion was  closed.  Subcutaneous tissue was approximated with 2-0 Vicryl and the skin was closed with staples.  Sterile dressings were applied.  Final radiographs were obtained, confirming hardware placement.  It was also confirmed that no femoral neck fracture was propagated during placement of hardware.  The patient was taken out of traction and the well legholder and placed on the table in supine position.  She was moved to a stretcher and awakened.  The patient had full motion and sensation of her foot.  Pulses were 2+ dorsalis pedis and posterior tibial.  The patient was transferred to the postoperative care unit.  The family was contacted.  She will be weightbearing as tolerated and will be seen by physical therapy the day following surgery.     ____________________________ Dawayne Patricia, MD sr:bjt D: 02/25/2012 14:44:13 ET T: 02/25/2012 15:22:00 ET JOB#: [id number]  cc: Dawayne Patricia, MD, <Dictator> Dawayne Patricia MD ELECTRONICALLY SIGNED 02/28/2012 15:38

## 2014-08-13 NOTE — Discharge Summary (Signed)
PATIENT NAME:  Christine Mclean, Christine Mclean MR#:  [id number] DATE OF BIRTH:  02-15-1927  DATE OF ADMISSION:  02/21/2012 DATE OF DISCHARGE:  02/25/2012  ATTENDING PHYSICIAN: Dr. Joie Bimler DICTATING: Klara Stjames, FNP  ADMITTING DIAGNOSIS: Right basicervical proximal femur fracture.   DISCHARGE DIAGNOSIS: Right basicervical proximal femur fracture.   OPERATION: On 02/22/2012 she had an IM nail of the right proximal femur.  SURGEON: Dawayne Patricia, MD  ANESTHESIA: Unsure, operative note not available for review.  ESTIMATED BLOOD LOSS: Unsure, operative note not available for review.  REPLACEMENTS: Unsure, operative note not available for review.  DRAINS: Hemovac.  COMPLICATIONS: None.  IMPLANTS USED: Unsure, operative note not available for review.  NOTE: The patient was stabilized, brought to the recovery room and then brought to the orthopedic floor where she was treated by physical therapy and for pain control.   HISTORY: Ms. Millican is an 81 year old white female who sustained a fall on 02/21/2012 when she was doing laundry and slipped in her laundry room floor. She did not have a loss of consciousness. She presented to [facility] ED on the same day where x-rays confirmed a right basicervical hip fracture. She underwent medical clearance and had surgery on 02/22/2012.   PHYSICAL EXAMINATION: GENERAL: Well developed, well nourished female in no apparent distress. CARDIOVASCULAR: Regular rate and rhythm. No murmurs or bruits. PULMONARY: Lungs are clear to auscultation bilaterally. Slight decrease in breath sounds bilateral lung bases. ABDOMEN: Soft, nontender, nondistended. Bowel sounds positive in all four quadrants. NEUROLOGICAL: Alert and oriented x3. MUSCULOSKELETAL: Right lower extremity is approximately 1.5 shortened and externally rotated. Foot is cool to touch. DP and PT pulses +1. Cap refill is 3 seconds. Lower extremity muscle strength 5/5. Sensation intact to right lower extremity. Two  small superficial abrasions of the right anterior knee.   HOSPITAL COURSE: After admission on 02/21/2012 the patient had surgery on 02/22/2012 where she had an IM nail of the right proximal femur. She had good pain control afterwards and brought to orthopedic floor from the recovery room. Initial postoperative hemoglobin was 7.7 and postoperative hematocrit was 22.1. Her preoperative was 10.5 and 31.9. She had a BUN of 21, creatinine of 1.02. The decision was made on 02/23/2012 to continue to monitor. On 02/24/2012 her hemoglobin dropped again to 6.7 and hematocrit 20.7. The decision was made to transfuse 1 unit of packed red cells and patient felt better afterwards. After the transfusion her hemoglobin was 8.4 and hematocrit 24.1. Also on 02/23/2012 she was hypoxic and 3 liters nasal cannula was applied. This was weaned off within 24 hours. The daughter requested vascular consult which was done on 02/23/2012 and plan was to see her back in the office for further workup of venous insufficiency. On 02/25/2012 her hemoglobin was 8.3 and her hematocrit was 23.2 and she has been tolerating physical therapy. The plan is for her to transfer to [facility] rehab.   CONDITION AT DISCHARGE: Stable.   DISPOSITION: Patient was sent to rehab at [facility] [place].   DISCHARGE INSTRUCTIONS: Patient will follow up with [facility] in two weeks for staple removal. She will do physical therapy and may be weight bearing as tolerated on the right lower extremity. Her diet is regular. TED hose will be used knee high bilaterally. Dressing can be changed as discussed and on an as needed basis. She should keep the incisions clean and dry. She will restart her home medicines which include lisinopril 40 mg half tablet p.o. b.i.d. and simvastatin 40 mg 1 tablet p.o. daily.  Additional medicines include MiraLax 17 grams p.o. daily p.r.n. constipation, iron sulfate 325 mg 1 tablet p.o. b.i.d., metoprolol 12.5 mg 1 tablet p.o. q.12  hours, enoxaparin 40 mg sub-Q daily, acetaminophen 50 mg 1 to 2 tablets p.o. q.4 hours pain or temperature greater than 100.4 and docusate/senna 50/8.6 mg 1 tablet p.o. b.i.d. p.r.n. constipation.    ____________________________ Shannen Flansburg M. Tretha Sciara, NP amb:cms D: 02/25/2012 11:37:43 ET T: 02/25/2012 11:41:06 ET JOB#: [id number]  cc: Bay Wayson M. Tretha Sciara, NP, <Dictator> Kem Kays Kelle Ruppert FNP ELECTRONICALLY SIGNED 03/08/2012 10:35
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
## 2014-08-16 NOTE — Discharge Summary (Signed)
PATIENT NAME:  Christine Mclean, Christine Mclean MR#:  [id number] DATE OF BIRTH:  [birth year]  DATE OF ADMISSION:  06/01/2012 DATE OF DISCHARGE:  06/02/2012  ADMITTING DIAGNOSIS:  Palpitations.   DISCHARGE DIAGNOSES: 1.  Palpitations of unclear etiology.  No further episodes during hospitalization.  Tele is negative, status post cardiac stress test which is negative for ischemia or infarction.  2.  Recent transient ischemic attack with left-sided weakness, which resolved.  3.  History of systolic congestive heart failure, which is chronic.  4.  History of hip fracture.  5.  Recent urinary tract infection during her previous hospitalization, currently being treated with Cipro.  CONSULTANTS:  [facility] cardiology evaluation.   LABORATORY AND DIAGNOSTIC STUDIES:  EKG, normal sinus rhythm with nonspecific ST-T wave changes.  Troponin 0.03.  BMP, glucose was 100, BUN 29, creatinine 1.05, sodium was 130, potassium 4.4, chloride 96, CO2 was 27.  LFTs were normal.  Her WBC count 9.6, hemoglobin 11, platelet count 298.  PA and lateral chest x-ray shows no acute abnormality.  CT scan of the head showed no acute abnormality.  BMP today shows a sodium of 136, potassium of 4.0, chloride 102.  Nuclear myocardial shows normal left ventricular function, normal wall motion.  No evidence of ischemia.   HOSPITAL COURSE:  Please refer to history and physical done by me on admission.  The patient is an 81 year old white female with history of hypertension, history of systolic congestive heart failure, was recently hospitalized for a TIA who presented with acute onset of severe palpitations that woke her up.  The patient also had some substernal discomfort prior.  She was placed under observation and serial cardiac enzymes were done which were negative.  She underwent a Lexi MIBI which was negative for ischemia or infarct.  The patient had no further palpitations, was seen by cardiology.  If she has recurrence of these symptoms, she will need an  outpatient Holter monitor.  At this time she is doing much better and is stable for discharge.   DISCHARGE ACTIVITY:  As tolerated.   DISCHARGE INSTRUCTIONS:  For history of systolic CHF given.   DISCHARGE MEDICATIONS:  Lisinopril 40 daily, simvastatin 20 at bedtime, oxycodone 5 mg q. 4 as needed for pain, Coreg 3.25 1 tab by mouth twice daily, oxybutynin 10 mg 1 tab by mouth daily, Lasix 20 mg 1 tab by mouth at bedtime, Cipro 500 by mouth q. 12 to finish the course.   DIET:  Low sodium.   ACTIVITY:  As tolerated.   FOLLOW-UP:  Follow with Dr. Lovie Macadamia in 1 to 2 weeks.  [facility] cardiology in 2 to 4 weeks.   TIME SPENT:  Thirty-five minutes spent on the discharge.      ____________________________ Lafonda Mosses. Posey Pronto, MD shp:ea D: 06/02/2012 15:15:42 ET T: 06/03/2012 06:14:09 ET JOB#: [id number]  cc: Rolfe Hartsell H. Posey Pronto, MD, <Dictator> Alric Seton MD ELECTRONICALLY SIGNED 06/06/2012 10:17

## 2014-08-16 NOTE — Discharge Summary (Signed)
PATIENT NAME:  Christine Mclean, Christine Mclean MR#:  [id number] DATE OF BIRTH:  1926/04/27  DATE OF ADMISSION:  05/01/2012 DATE OF DISCHARGE:  05/03/2012  PRIMARY CARE PHYSICIAN: Dr. Juluis Pitch.   CHIEF COMPLAINT: Tired, weakness and elevated blood pressure.   DISCHARGE DIAGNOSES:  1. Malignant hypertension.  2. Acute systolic congestive heart failure.  3. Elevated troponin, likely demand ischemia.  4. Recent hip fracture on outpatient physical therapy.  5. History of recent urinary tract infection.   DISCHARGE MEDICATIONS: Oxybutynin 5 mg 2 tabs once a day, oxycodone 5 mg 1 to 2 caps every 4 hours as needed, lisinopril 40 mg daily, metoprolol tartrate 50 mg 1 tab every 12 hours, simvastatin 20 mg once a day, furosemide 20 mg 1 tab daily.  DISCHARGE INSTRUCTIONS: The patient will be going home with home health, physical therapy and a nurse.   DIET: Low sodium, low fat, low cholesterol.   ACTIVITY: As tolerated.   FOLLOWUP: Please follow with your cardiologist and PCP within 1 to 2 weeks.   HISTORY OF PRESENT ILLNESS: For full details of H and P, please see the dictation of the H and P on January 6th by Dr. Margaretmary Eddy. This is 81 year old female with history of cervical cancer, hypertension, recent hip fracture status post repair in October 2013, came into the ER for the above chief complaint. The patient was found to have a significant elevation of blood pressure as high as 230/120 and the patient had been having intermittent episodes of dyspnea on exertion and swelling in her feet which is new for her. She was admitted to the hospitalist service. The patient also was noted to have elevated troponin of 0.08.   SIGNIFICANT LABORATORY DATA AND IMAGING: Initial troponin 0.08, then 0.11, then 0.11 and CK-MB fractions were within normal limits x 2. Initial WBC 9.1, hemoglobin 10.8. Urine culture: Mixed flora. UA not suggestive of infection. BNP on arrival 1,358. BUN 15, creatinine 0.71, sodium 144. LDL 48,  HDL 28. Echocardiogram done on January 6th showed EF of 40%, left atrium mildly dilated, right atrium mildly dilated, moderate mitral regurg, mild to moderate tricuspid regurg, mild aortic regurg. CT of the head without contrast showing chronic changes, no definitive acute intracranial abnormality. Chest x-ray PA and lateral showing hyperinflation of both lungs consistent with COPD, mildly increased interstitial marking likely are chronic and related to COPD.  HOSPITAL COURSE: The patient was admitted to the hospitalist service. The patient with signs and symptoms of CHF, elevated troponin and malignant hypertension on arrival, was admitted to the telemetry unit. Medications were adjusted. Troponins were cycled and Cardiology was consulted. The patient was seen by Dr. Ubaldo Glassing from [facility] [place]. She did not have any significant chest pain but did have shortness of breath. The patient likely had malignant hypertension and acute systolic CHF. They were both treated with adjustments to her medications with initiation of IV Lasix. Her symptoms did improve. She has no chest pain. Malignant hypertension has resolved. EF was found to be 40% which puts her into acute systolic CHF category. At this point, she does not appear to be volume overloaded. Elevated troponin were likely demand ischemia from malignant hypertension and acute systolic CHF. Urine cultures have shown mixed flora and although she was started on an antibiotic, i.e. ceftriaxone here, that will be stopped as there is no definitive evidence of UTI. The patient was seen by PT and their recommendation is home health PT with RN given the right hip fracture, CHF, and malignant hypertension  and therefore that has been arranged and she will be discharged with outpatient followup.   CODE STATUS: The patient is full code.   Total time spent: 35 minutes.     ____________________________ Vivien Presto, MD sa:es D: 05/03/2012 10:53:44 ET T: 05/03/2012  11:19:48 ET JOB#: [id number]  cc: Vivien Presto, MD, <Dictator> Youlanda Roys. Lovie Macadamia, MD Vivien Presto MD ELECTRONICALLY SIGNED 05/13/2012 17:23

## 2014-08-16 NOTE — H&P (Signed)
PATIENT NAME:  Christine Mclean, Christine Mclean MR#:  [id number] DATE OF BIRTH:  1927/03/30  DATE OF ADMISSION:  05/26/2012  PRIMARY DOCTOR: Dr. Lovie Macadamia.  ER PHYSICIAN: Dr. Esperanza Heir.   CHIEF COMPLAINT: Confusion.   HISTORY OF PRESENT ILLNESS: The patient is an 81 year old female patient who was discharged recently on 05/03/2012, came in because the patient was confused this morning. The patient also has some expressive aphasia. This is according to the daughter so she had some confusion and expressive aphasia and also left side weakness, which actually resolved after some time. The patient's blood pressure was elevated at 189/79, that was at done a [facility] so the daughters brought her here. The patient right now denies any complaints. She says she felt a little confused, unable to get the words out and she does not remember her daughter giving her money, and also now she feels okay.   PAST MEDICAL HISTORY: Significant for admission in January from 01/06 to 01/08 with malignant hypertension. She has a history of systolic heart failure and also a history of a hip fracture with outpatient physical therapy. According to his daughter she just finished the physical therapy, and also a history of UTI before.   MEDICATIONS: Aspirin 81 mg daily, Coreg 6.25 mg p.o. b.i.d., furosemide 20 mg daily, lisinopril 40 mg daily, oxybutynin 10 mg extended release daily, oxycodone 5 mg every 4 hours as needed, and simvastatin 20 mg at bedtime   ALLERGIES: No known allergies.   PAST SURGICAL HISTORY: Significant for: 1. Right hip fracture, repaired in 01/2012.  2. Appendectomy.  3. Hysterectomy.  4. Cataract extraction.  5. A history of cervical cancer.   SOCIAL HISTORY: The patient lives with her daughter. No smoking. No drinking. No drugs.   FAMILY HISTORY: Hypertension runs in the family.   REVIEW OF SYSTEMS:  CONSTITUTIONAL: The patient denies fever, has some fatigue.  EYES: The patient has no blurred vision.   ENT: Hard of hearing, has a hearing aid. No tinnitus. No epistaxis. No difficulty swallowing.  RESPIRATORY: Denies any cough.  CARDIOVASCULAR: No chest pain. No orthopnea. Does have pedal edema. She denies any palpitations.  GASTROINTESTINAL: Denied lower quadrant abdominal pain. Thinks that is because of physical therapy. No nausea. No vomiting. No constipation. No diarrhea.  GENITOURINARY: No dysuria.  ENDOCRINE: No polyuria or nocturia.  INTEGUMENTARY: No skin rashes.  MUSCULOSKELETAL: Denies any neck pain or back pain, just recovering from right hip fracture surgery. Just finished her physical therapy.  NEUROLOGIC: The patient has transient weakness in the left side and also some expressive aphasia this morning which is resolved.  PSYCHIATRIC: No anxiety or insomnia.   PHYSICAL EXAMINATION:  VITAL SIGNS: Temperature 98, heart rate is 69, blood pressure 122/59, sats 97% on room air, awake, oriented.  HEENT: Atraumatic, normocephalic. Pupils equally reacting to light. Extraocular movements are intact.  ENT: No tympanic membrane congestion. No turbinate hypertrophy. No oropharyngeal erythema.  NECK: Normal range of motion. No JVD. No carotid bruit.  CARDIOVASCULAR: S1, S2 regular. No murmurs.  LUNGS: Clear to auscultation. No wheeze. No rales. The patient not using accessory muscles for respiration. No chest wall tenderness.  ABDOMEN: Soft, nontender. Bowel sounds present. No organomegaly.  NEUROLOGIC: Alert, awake, oriented. Cranial nerves II through XII intact. Power 5/5 upper and lower extremities. (sensations  are intact DTR 2+ bilaterally.  MUSCULOSKELETAL: The patient has no joint effusions.  EXTREMITIES: 2+ pitting edema bilaterally. No cyanosis. No clubbing.   LABORATORY, DIAGNOSTIC AND RADIOLOGICAL DATA: CT of the  head done which showed atrophy and chronic microvascular ischemic changes. Chest x-ray shows no evidence of cardiopulmonary abnormality. WBC 9.6, hemoglobin 11,  hematocrit 32.7, platelets 298.   ELECTROLYTES: Sodium 130, potassium 4.4, chloride 96, bicarb 27, BUN 29, creatinine 1.05, glucose 100. Troponin 0.03.   EKG showed normal sinus rhythm with no ST-T changes, 69 beats per minute.   ASSESSMENT AND PLAN: The patient is an 81 year old female patient who came in because of transient expressive aphasia with left-sided weakness. The patient will be admitted for overnight observation for possible transient ischemic attack, do the neuro checks every 2 hours for 4 times and keep up every 4 hours for 24 hours. Get MRA of the brain. The patient had an echocardiogram done during the last admission that is on 01/06 which showed ejection fraction of 40%. She is already on ACE inhibitors and also Lasix. We are going to continue on that and we will not order a repeat echocardiogram instead we will get the carotid ultrasound and follow the MRI of the brain. Her LDL is at goal so we will continue statins.   A history of chronic systolic heart failure with ejection fraction of around 40% with global hypokinesia. The patient will be on ACE inhibitors, beta blockers and also Lasix, continue them and check daily weights and monitor input and output.    TIME SPENT ON HISTORY AND PHYSICAL: More than 50 minutes.  Discussed the plans with the daughter.  ____________________________ Epifanio Lesches, MD sk:jm D: 05/26/2012 17:05:06 ET T: 05/26/2012 17:46:07 ET JOB#: [id number]  cc: Epifanio Lesches, MD, <Dictator> Epifanio Lesches MD ELECTRONICALLY SIGNED 06/20/2012 22:19

## 2014-08-16 NOTE — H&P (Signed)
PATIENT NAME:  Christine Mclean, Christine Mclean MR#:  [id number] DATE OF BIRTH:  1926-12-14  DATE OF ADMISSION:  06/01/2012  PRIMARY CARE PROVIDER:  Dr. Lovie Macadamia, ED  REFERRING PHYSICIAN:  Dr. Owens Shark  CHIEF COMPLAINT:  Palpitations.   HISTORY OF PRESENT ILLNESS:  The patient is an 81 year old white female with history of malignant hypertension, history of systolic CHF who was recently hospitalized for possible TIA who reports that she was doing well after discharge to home. Today woke up with her heart beating very fast and pounding for about 10 minutes. Due to these symptoms, she came to the ED. In the ER she was noted to have normal sinus rhythm without any ST-T wave changes. The patient reports that she did have some feeling of heartburn yesterday, but has not had any left-sided chest pain. She did not have any shortness of breath. She has not had any further symptoms of asymmetrical weakness or any changes in her speech. The patient reports that she has never had this type of symptoms before.   PAST MEDICAL HISTORY:  Significant for: 1.  History of admission in January from 01/06 to 01/08 for malignant hypertension. She does have a history of systolic CHF based on echo done January 2014.  2.  History of hip fracture. 3.  History of UTI. 4. Recent hospitalization for TIA with left-sided weakness, which resolved. Carotid Dopplers were negative. MRI of the brain was negative as well.   CURRENT MEDICATIONS:  She is on lisinopril 40 daily, simvastatin 20 daily, aspirin 81 one tab p.o. daily, oxycodone 5 mg q. 4 p.r.n. for pain, Coreg 6.25 one tab p.o. b.i.d., Lasix 20 alternate days, oxybutynin 10 mg every day. She is also on Cipro 500 one tab p.o. q. 12 for UTI.  SOCIAL HISTORY:  Does not smoke. Does not drink. No drugs.   FAMILY HISTORY:  Positive for hypertension.  REVIEW OF SYSTEMS:   CONSTITUTIONAL:  Denies any fevers. Complains of some fatigueness.  EYES:  No blurred vision. No glaucoma. No cataracts.   ENT:  A little hard of hearing, has a hearing aid. No tinnitus. No epistaxis. No difficulty swallowing.  RESPIRATORY: Denies any cough, wheezing. No hemoptysis. No COPD.  CARDIOVASCULAR:  Denies any chest pain, orthopnea. Denies any palpitations.  GASTROINTESTINAL  Denies any abdominal pain, nausea, vomiting or diarrhea.  GENITOURINARY  Denies any frequency, urgency or hesitancy.  ENDOCRINE:  Denies any polydipsia or polyphagia.  SKIN:  Denies any rashes.  MUSCULOSKELETAL:  Denies any neck pain or back pain.  NEUROLOGICAL: The patient denies any numbness, weakness. Has a history of TIA.  PSYCHIATRIC:  No anxiety or insomnia.   PHYSICAL EXAMINATION: VITAL SIGNS: Temperature 98, pulse 65, respirations 18, blood pressure 145/62, O2 98%.  GENERAL:  The patient is an elderly female in no acute distress.  HEENT: Head atraumatic, normocephalic. Pupils equally round, reactive to light and accommodation. There is no conjunctivae or pallor. No scleral icterus. Nasal exam shows no drainage or ulceration.  Oropharynx is clear without any exudates.  NECK:  No thyromegaly. No carotid bruits.  CARDIOVASCULAR: Regular rate and rhythm. No murmurs, rubs, clicks or gallops. PMI is not displaced.  ABDOMEN: Soft, nontender, nondistended. Positive bowel sounds x 4. There is no hepatosplenomegaly.  EXTREMITIES:  No clubbing, cyanosis or edema.  SKIN:  No rash.  LYMPHATICS:  No lymph nodes palpable.  VASCULAR:  Good DP and PT pulses.  PSYCHIATRIC: Not anxious or depressed.  LABORATORY EVALUATIONS::  Glucose 106, BNP is 314,  BUN 15, creatinine 0.81, sodium 130, potassium 4.2, chloride 100, CO2 is 25. LFTs are normal. Troponin 0.03. TSH 3.10, WBC 6.6, hemoglobin 10.5, platelet count 279, INR 0.9.  EKG showed normal sinus rhythm without any ST-T wave changes. Chest x-ray showed no acute cardiopulmonary processes.   ASSESSMENT AND PLAN:  The patient is an 80 year old with history of systolic congestive heart  failure, hypertension, was in the hospital for transient ischemic attack symptoms presents with severe palpitations that woke her up. 1.  Palpitations at high risk of arrhythmia with her systolic dysfunction. At this time we will place under observation telemetry, cardiology evaluation. Echo done recently. No need to repeat. Will check a TSH. 2.  Hyponatremia intermittently chronic, likely due to Lasix therapy. We will hold Lasix for today.  3.  Hypertension. Continue lisinopril.  4.  Hyperlipidemia. Continue simvastatin.  5.  Miscellaneous. We will place her on Lovenox for deep vein thrombosis prophylaxis.  NOTE:  32 minutes spent.   ____________________________ Lafonda Mosses. Posey Pronto, MD shp:ce D: 06/01/2012 13:59:07 ET T: 06/01/2012 14:24:04 ET JOB#: [id number]  cc: Shazia Mitchener H. Posey Pronto, MD, <Dictator> Alric Seton MD ELECTRONICALLY SIGNED 06/06/2012 10:16

## 2014-08-16 NOTE — Consult Note (Signed)
General Aspect 81 year old female with history of hospitalization for congestive heart failure, hypertension in January of 2014 EF at that time 40% with mild global hypokinesis moderate MR mild-to-moderate TR.  She was diuresed with borderline troponin felt secondary to demand ischemia.  Patient was hospitalized possibly a week ago with possible TIA. She also had surgery after fracture of her hip in October and has not fully rehabbed from this and is living with her daughter. she was diagnosed with UTI started antibiotics on Tuesday. yesterday around 6 PM she noticed indigestion type feeling that persisted after she ate dinner until about midnight.  She went them went to sleep, about 2:00 in the morning drank some water and the burning returned.  However she was able to go back to sleep and woke about 4:00 this morning with sensation of her heart pounding.  She called her daughter who took her vital signs with a blood pressure of 761 systolic and heart rate is 72.  Due to the elevation of blood pressure they proceeded to the ER.  She has been on telemetry without any arrhythmias noted.  Her EKG did not show any acute changes or arrhythmias.  Her cardiac enzymes have been negative.  She is currently comfortable and without complaints.   Physical Exam:   GEN well developed, no acute distress    HEENT pink conjunctivae, hearing intact to voice    RESP normal resp effort  clear BS    CARD Regular rate and rhythm  Normal, S1, S2    EXTR negative edema    SKIN normal to palpation    NEURO cranial nerves intact, motor/sensory function intact   Review of Systems:   Subjective/Chief Complaint none at present    Cardiovascular: Chest pain or discomfort  Palpitations    Gastrointestinal: Heartburn    Genitourinary: recent UTI and being treated with antibiotics    Musculoskeletal: recovering from hip surgery walking with a walker    Review of Systems: All other systems were reviewed and found to  be negative   Lab Results: Thyroid:  06-Feb-14 05:40    Thyroid Stimulating Hormone 3.10 (0.45-4.50 (International Unit)  ----------------------- Pregnant patients have  different reference  ranges for TSH:  - - - - - - - - - -  Pregnant, first trimetser:  0.36 - 2.50 uIU/mL)  Hepatic:  06-Feb-14 05:40    Bilirubin, Total 0.2   Alkaline Phosphatase 70   SGPT (ALT) 36   SGOT (AST) 34   Total Protein, Serum 7.4   Albumin, Serum 3.7  Routine Chem:  06-Feb-14 05:40    B-Type Natriuretic Peptide ([facility]) 314 (Result(s) reported on 01 Jun 2012 at 07:22AM.)   Glucose, Serum  106   BUN 15   Creatinine (comp) 0.81   Sodium, Serum  130   Potassium, Serum 4.2   Chloride, Serum 100   CO2, Serum 25   Calcium (Total), Serum 9.3   Osmolality (calc) 262   eGFR (African American) >60   eGFR (Non-African American) >60 (eGFR values <65m/min/1.73 m2 may be an indication of chronic kidney disease (CKD). Calculated eGFR is useful in patients with stable renal function. The eGFR calculation will not be reliable in acutely ill patients when serum creatinine is changing rapidly. It is not useful in  patients on dialysis. The eGFR calculation may not be applicable to patients at the low and high extremes of body sizes, pregnant women, and vegetarians.)   Anion Gap  5  Cardiac:  06-Feb-14 05:40    Troponin I 0.03 (0.00-0.05 0.05 ng/mL or less: NEGATIVE  Repeat testing in 3-6 hrs  if clinically indicated. >0.05 ng/mL: POTENTIAL  MYOCARDIAL INJURY. Repeat  testing in 3-6 hrs if  clinically indicated. NOTE: An increase or decrease  of 30% or more on serial  testing suggests a  clinically important change)   CK, Total 70   CPK-MB, Serum 1.2 (Result(s) reported on 01 Jun 2012 at [facility].)  Routine Coag:  06-Feb-14 05:40    Prothrombin 12.6   INR 0.9 (INR reference interval applies to patients on anticoagulant therapy. A single INR therapeutic range for coumarins is not optimal for  all indications; however, the suggested range for most indications is 2.0 - 3.0. Exceptions to the INR Reference Range may include: Prosthetic heart valves, acute myocardial infarction, prevention of myocardial infarction, and combinations of aspirin and anticoagulant. The need for a higher or lower target INR must be assessed individually. Reference: The Pharmacology and Management of the Vitamin K  antagonists: the seventh ACCP Conference on Antithrombotic and Thrombolytic Therapy. QQVZD.6387 Sept:126 (3suppl): [id number]. A HCT value >55% may artifactually increase the PT.  In one study,  the increase was an average of 25%. Reference:  "Effect on Routine and Special Coagulation Testing Values of Citrate Anticoagulant Adjustment in Patients with High HCT Values." American Journal of Clinical Pathology 2006;126:400-405.)   Activated PTT (APTT) 35.4 (A HCT value >55% may artifactually increase the APTT. In one study, the increase was an average of 19%. Reference: "Effect on Routine and Special Coagulation Testing Values of Citrate Anticoagulant Adjustment in Patients with High HCT Values." American Journal of Clinical Pathology 2006;126:400-405.)  Routine Hem:  06-Feb-14 05:40    WBC (CBC) 6.6   RBC (CBC)  3.49   Hemoglobin (CBC)  10.5   Hematocrit (CBC)  31.2   Platelet Count (CBC) 279 (Result(s) reported on 01 Jun 2012 at 05:59AM.)   MCV 89   MCH 30.0   MCHC 33.6   RDW 14.1    No Known Allergies:   Vital Signs/Nurse's Notes: **Vital Signs.:   06-Feb-14 10:53   Vital Signs Type Admission   Temperature Temperature (F) 98   Celsius 36.6   Temperature Source oral   Pulse Pulse 65   Respirations Respirations 18   Systolic BP Systolic BP 564   Diastolic BP (mmHg) Diastolic BP (mmHg) 62   Mean BP 89   Pulse Ox % Pulse Ox % 98   Pulse Ox Activity Level  At rest   Oxygen Delivery Room Air/ 21 %     Impression 81 year old female status post hip fracture and repair, recently  diagnosed in January with malignant hypertension, congestive heart failure with valvular insufficiency, mildly reduced EF, question of TIA in February, with recent complaints of chest pain "indigestion" and palpitations questionable etiology.    Plan 1.Patient's chest discomfort sounds atypical for CAD possibly more related to G,I recently started on antibiotics and with chest discomfort worsened by drinking water.  She has had no evidence of arrhythmias by EKG or monitor but will continue telemetry to watch for same.  With patient's chest discomfort complaints, consider possible stress tes, inpatient if the patient has other episodes while hospitalize, or on an outpatient basis if she stays stable. 2.  Blood pressure is well controlled at this point, continue current medications.  Patient was seen collaboration with Dr. Nehemiah Massed.   Electronic Signatures: Roderic Palau (NP)  (Signed 06-Feb-14 16:32)  Authored: General Aspect/Present Illness, History  and Physical Exam, Review of System, Labs, Allergies, Vital Signs/Nurse's Notes, Impression/Plan   Last Updated: 06-Feb-14 16:32 by Roderic Palau (NP)

## 2014-08-16 NOTE — Discharge Summary (Signed)
PATIENT NAME:  Christine Mclean, GOERTZ MR#:  [id number] DATE OF BIRTH:  [birth year]  DATE OF ADMISSION:  05/26/2012 DATE OF DISCHARGE:  05/27/2012  DISCHARGE DIAGNOSES: 1. Transient aphasia and left-sided weakness, resolved. 2. Hypertension.  3. Chronic systolic heart failure.   DISCHARGE MEDICATIONS: 1. Lisinopril 40 mg daily. 2. Simvastatin 20 mg daily. 3. Aspirin 81 mg daily. 4. Oxycodone 5 mg every 4 hours as needed for pain. 5. Coreg 6.25 mg p.o. 2 times a day. 6. Furosemide 20 mg on alternate days. 7. Oxybutynin 10 mg every day.  HOME OXYGEN: None.   DIET: Low sodium diet.  DISPOSITION: The patient went home with her daughter.   LABS/RADIOLOGIC STUDIES: MRI of the brain showed no acute intracranial pathology.   Troponin 0.04.  The patient's ultrasound of the carotids showed no hemodynamically significant carotid artery stenosis.   Chest x-ray showed no acute abnormality.   White count 9.6, hemoglobin 11, hematocrit 32.7 and platelets 298. Electrolytes: Sodium 130, potassium 4.4, chloride 96, bicarbonate 27, BUN 29, creatinine 1.05 and glucose 100. Troponin less than 0.03.   DISCHARGE VITALS: Temperature 98 degrees Fahrenheit, heart rate 62, respirations 16, blood pressure 123/50 and sats 97% on room air. The patient's primary doctor is Dr. Lovie Macadamia.  HOSPITAL COURSE: The patient is an 81 year old female patient admitted because of transient expressive aphasia with left side weakness. The patient was brought in by the daughter. Initially when her blood pressure was checked at [facility] it was 189/79, so she thought the patient had high blood pressure and possible TIA. Look at the history and physical for full details. Admitted for possible TIA and dehydration. The patient continued on her blood pressure medications. CT of the head is normal. MRI of brain did not show any acute changes. Carotid ultrasound was normal. The patient's echocardiogram was done during the last admission, in  January, which showed EF of 30%. She is already on ACE inhibitors and Lasix. So we continued that. MRI of the brain was normal. The patient complained of chest pressure, on like February 1st, early morning, and troponins have been negative. EKG did not show any changes. The patient was chest pain free during my visit. The patient was sent home and discussed the plan with the patient's daughter.   TIME SPENT: More 30 minutes.  ____________________________ Epifanio Lesches, MD sk:sb D: 05/28/2012 13:32:13 ET T: 05/29/2012 07:40:25 ET JOB#: [id number]  cc: Epifanio Lesches, MD, <Dictator> Youlanda Roys. Lovie Macadamia, MD Epifanio Lesches MD ELECTRONICALLY SIGNED 06/12/2012 22:44

## 2014-08-16 NOTE — Consult Note (Signed)
    General Aspect 81 yo female with history of hypertension admitted after presenting to the er with complaints of peripheral edema, uti symptoms, weakness, fatigue. She was noted to have a troponin of 0.08. EKG was unremarkable for ischemia and cxr was normal. BNP was 1300. She denies chest pain. She denies shortness of breath. She states she was treated for hypertension in the past but was taken off of her meds due to no further problem with her pressures. She denis orthopnea or pnd. She denies any prior cardiac symptoms   Physical Exam:   GEN well developed, well nourished, no acute distress    HEENT PERRL, hearing intact to voice    NECK supple    RESP normal resp effort  clear BS    CARD Regular rate and rhythm  No murmur    ABD denies tenderness  normal BS    LYMPH negative neck, negative axillae    EXTR negative cyanosis/clubbing, positive edema, 1+ ankle edema    SKIN normal to palpation    NEURO cranial nerves intact, motor/sensory function intact    PSYCH A+O to time, place, person, anxious   Review of Systems:   Subjective/Chief Complaint edema with weakness    General: Fatigue  Weakness  Trouble sleeping    Skin: No Complaints    ENT: No Complaints    Eyes: No Complaints    Neck: No Complaints    Respiratory: No Complaints    Cardiovascular: Edema    Gastrointestinal: No Complaints    Genitourinary: No Complaints    Vascular: No Complaints    Musculoskeletal: No Complaints    Neurologic: No Complaints    Hematologic: No Complaints    Endocrine: No Complaints    Psychiatric: No Complaints    Review of Systems: All other systems were reviewed and found to be negative    Medications/Allergies Reviewed Medications/Allergies reviewed     Cervical cancer:    Hypertension:    Hard of Hearing:    Appendectomy:    Cataract Extraction:    Hysterectomy:   Home Medications: Medication Instructions Status  oxybutynin 5 mg oral tablet 2  tab(s) orally once a day Active  Lopressor 50 mg oral tablet 0.5 tab(s) orally 2 times a day Active  lisinopril 20 mg oral tablet tab(s) orally 2 times a day Active  oxycodone 5 mg oral capsule 1 -2cap(s) orally every 4 hours, As Needed Active   EKG:   EKG NSR    No Known Allergies:     Impression 81 yo female with history of hypertension admitted with complaints of weakness, fatigue, peripheral edema and accellerated hypertension. She was noted to have a mild troponin elevation at 0.08 with normal ekg and cxr. BNP 1300. 1+ edema in her ankles. Etiology of edema is unclear. Will review echo to determine for evidence of valvular or other structural abnormality. Conitnue to control blood pressure with current meds. Conitnue to rule out for mi. Gentle diuresis following renal funciton    Plan 1. Continue careful diuresis 2. Control blood pressure with current meds 3. Echo to evaluate lv function and valves 4. Low sodium diet 5. Daily weights after discharge 6. Further recs pending echo and course   Electronic Signatures: Teodoro Spray (MD)  (Signed 06-Jan-14 07:11)  Authored: General Aspect/Present Illness, History and Physical Exam, Review of System, Past Medical History, Home Medications, EKG , Allergies, Impression/Plan   Last Updated: 06-Jan-14 07:11 by Teodoro Spray (MD)

## 2014-08-16 NOTE — H&P (Signed)
PATIENT NAME:  Christine Mclean, Christine Mclean MR#:  [id number] DATE OF BIRTH:  March 27, 1927  DATE OF ADMISSION:  05/01/2012  PRIMARY CARE PHYSICIAN: Dr. Juluis Pitch.  REFERRING PHYSICIAN: Dr. Corky Downs.   CHIEF COMPLAINT: Weakness and tiredness with elevated blood pressure.   HISTORY OF PRESENT ILLNESS: The patient is an 81 year old pleasant Caucasian female with a past medical history of cervical cancer, hypertension, hearing defect, and a recent history of right hip fracture status post repair in October 2013, was brought into the ER for elevated blood pressure. The patient was discharged from the hospital to rehab center and subsequently, she was discharged home. The patient is right now living with her daughter and has been feeling weak and tired for the past one week. When Home Health checked her blood pressure, her blood pressure was elevated at 148/90 on Thursday as reported by daughter. As patient is feeling weak and tired, she rechecked her blood pressure today which was very high at 230/120. Also, the patient has been experiencing intermittent episodes of dyspnea on exertion and has swelling in her both feet which is quite new. She denies any chest pain. While they were coming to the ER, the patient started experiencing headache, also. Also, patient's daughter has noticed some hemorrhage in the conjunctival area of the right eye. In the ER, the patient's initial blood pressure was 212/86. She has received labetalol 20 mg IV and subsequently, her blood pressure dropped down to 188/78. During my examination, the patient denies any chest pain or shortness of breath. Chest x-ray has revealed no acute findings. BNP is elevated at 1,358. Troponin is elevated at 0.08. UA is weakly positive of leukocyte esterase. Daughter is at bedside. The patient is hard of hearing. Denies any past medical history of heart attack or congestive heart failure. Not seen by any cardiologist in the past. Denies any stress test in the past.    PAST MEDICAL HISTORY: Hypertension, cervical cancer, hearing defect.   PAST SURGICAL HISTORY: Right hip fracture repair in October 2013, cataract extraction, appendectomy, and hysterectomy.   ALLERGIES: She has no known drug allergies.   HOME MEDICATIONS: Oxycodone 5 mg q.4 hours as needed for pain, Lopressor 50 mg one-half tablet twice a day, oxybutynin 5 mg 2 times a day, lisinopril 40 mg one-half tablet 2 times a day.   PSYCHOSOCIAL HISTORY: Currently living with daughter after discharging from the rehab. Denies smoking, alcohol, or illicit drug usage.   FAMILY HISTORY: Reviewed. Hypertension runs in her family.   REVIEW OF SYSTEMS: CONSTITUTIONAL: Denies fever but complaining of fatigue and weakness. Denies any pain. Gained weight .she has bilateral lower extremity edema.  EYES: Denies any blurry vision but has noticed redness from hemorrhage in the conjunctival area in the right eye. Denies any glaucoma. Status post cataract repair.  ENT: Denies tinnitus, epistaxis, discharge, postnasal drip, sinus pain, difficulty in swallowing.  RESPIRATORY: Denies any cough, wheezing, hemoptysis. Complaining of dyspnea on exertion. No painful respiration.  CARDIOVASCULAR: Denies any chest pain, denies orthopnea. Complaining of bilateral lower extremity edema. Denies any palpitations, syncope, varicose veins.  GASTROINTESTINAL: Denies nausea, vomiting, diarrhea, abdominal pain, jaundice, rectal bleeding, constipation.  GENITOURINARY: Denies dysuria, hematuria, renal calculi.  GYNECOLOGIC AND BREASTS: Denies breast mass, vaginal discharge. ENDOCRINE: Denies polyuria, polyphagia, polydipsia, nocturia, thyroid problems. HEMATOLOGIC/LYMPHATIC: Denies anemia or easy bruising.  INTEGUMENTARY: Denies acne, rash, lesions.  MUSCULOSKELETAL: Denies any pain in the neck, back, or shoulder. Denies any arthritis or limited activity. The patient is ambulating with the help of  a cane.  NEUROLOGIC: Complaining of  weakness but denies any vertigo, ataxia, dementia. Complaining of headache. Denies any TIA.  PSYCHIATRIC: Denies any insomnia, ADD, OCD,   PHYSICAL EXAMINATION:  VITAL SIGNS: Temperature 98 degrees Fahrenheit, pulse 72, respirations 18, blood pressure 188/78 after labetalol.  GENERAL APPEARANCE: Not in acute distress, moderately built and moderately nourished.  HEENT: Normocephalic. Pupils are equally reacting to light and accommodation. Positive subconjunctival hemorrhage on the nasal side of the right eye, but pupils are equally reacting to light and accommodation. Extraocular movements are intact. No nasal discharge. No sinus tenderness. No postnasal drip. No pharyngeal edema. Tympanic membranes are intact.  NECK: Supple. No JVD. No carotid bruits. No thyromegaly.  LUNGS: Minimal rales at the bases but good air entry. No accessory muscle usage. No anterior chest wall tenderness.  CARDIAC: S1, S2 normal. Regular rate and rhythm. Point of maximal impulse is intact.  GASTROINTESTINAL: Soft. Bowel sounds are positive in all 4 quadrants. Nontender, nondistended. No masses felt.  NEUROLOGIC: Awake, alert, and oriented x3. Answering questions appropriately, but hard of hearing. Motor and sensory are grossly intact. Reflexes are 2+.  MUSCULOSKELETAL: A right hip scar area is healing well. Nontender. No erythema. No joint effusion, tenderness, or erythema.  SKIN: No rashes. No lesions. No acne.  EXTREMITIES: Positive +2 pedal edema bilaterally which is pitting in nature. No cyanosis. No clubbing.   LABORATORY DATA AND IMAGING STUDIES: The 12-lead EKG has revealed normal sinus rhythm, nonspecific ST-T wave changes. Normal PR and QRS intervals. Glucose 95. BNP 1,358. BUN 15, creatinine 0.71, sodium 144, potassium 3.7, chloride 106, CO2 of 28, serum calcium is 9.6. Troponin T is elevated at 0.08. WBC 9.1, hemoglobin 10.8, hematocrit 33.2, platelet count 330,000. Urinalysis: Colorless, clear in appearance,  bili negative, ketones negative, specific gravity 1.002, nitrite negative, leukocyte esterase, epithelial cells none. Chest x-ray: No acute findings.   ASSESSMENT: An 81 year old female presenting to the Emergency Room with a chief complaint of weakness and tiredness for one week with a new-onset bilateral lower extremity edema for one week as well. She is also complaining of dyspnea on exertion and in the ER, BNP and troponins are elevated. Initial blood pressure was 230/120.   PLAN:  1. Malignant hypertension with headache: Will obtain stat CT head to rule out any acute intracranial changes. The patient has received one dose of IV labetalol. Will give her metoprolol 50 mg stat and will continue lisinopril 20 mg once daily.  2. Dyspnea on exertion with bilateral lower extremity pitting edema in the setting of elevated BNP and positive troponin. These findings are from malignant hypertension, but we have to rule out acute MI and new-onset congestive heart failure. Plan is to get 2-D echocardiogram. Will cycle cardiac biomarkers. If CT head is negative, the patient will be receiving 325 mg of aspirin and Lovenox 1 mg per kg subcu x 1, and will continue subcu q.12 hours. Cardiology consult is placed to Dr. Ubaldo Glassing and notified to him. He is aware of the consult. Will give the patient Lasix 20 mg IV x 1 and q.12 hours. Will strictly monitor in's and out's, and measure daily weights.  2. Urinary tract infection per urinalysis. Will obtain urine culture and sensitivity, and I will give her IV Rocephin.  3. Status post right hip fracture and repair recently. PT is consulted and the patient can get physical therapy if acute MI is ruled out.  4. Hyperlipidemia. Will check fasting lipid panel and continue statin.  5.  GI prophylaxis with ranitidine and DVT prophylaxis with Lovenox 1 mg per kg subcu q.12 hours if CT head is negative.   CODE STATUS: Full code.   The diagnosis and plan of care was discussed in detail  with the patient and daughter at bedside. They both verbalized understanding of the plan.   Total time spent on admission is 60 minutes.     ____________________________ Nicholes Mango, MD ag:es D: 05/01/2012 04:19:19 ET T: 05/01/2012 08:23:42 ET JOB#: [id number]  cc: Nicholes Mango, MD, <Dictator> Youlanda Roys. Lovie Macadamia, MD Nicholes Mango MD ELECTRONICALLY SIGNED 05/11/2012 7:10

## 2015-01-30 DIAGNOSIS — M545 Low back pain, unspecified: Secondary | ICD-10-CM | POA: Insufficient documentation

## 2015-01-30 DIAGNOSIS — M8580 Other specified disorders of bone density and structure, unspecified site: Secondary | ICD-10-CM | POA: Insufficient documentation

## 2015-01-30 DIAGNOSIS — M5136 Other intervertebral disc degeneration, lumbar region: Secondary | ICD-10-CM | POA: Insufficient documentation

## 2015-02-11 DIAGNOSIS — S32000A Wedge compression fracture of unspecified lumbar vertebra, initial encounter for closed fracture: Secondary | ICD-10-CM | POA: Insufficient documentation

## 2015-05-02 ENCOUNTER — Encounter: Payer: Self-pay | Admitting: Family Medicine

## 2015-05-12 ENCOUNTER — Ambulatory Visit (INDEPENDENT_AMBULATORY_CARE_PROVIDER_SITE_OTHER): Payer: Medicare Other | Admitting: Urology

## 2015-05-12 ENCOUNTER — Encounter: Payer: Self-pay | Admitting: Urology

## 2015-05-12 VITALS — BP 155/73 | HR 96 | Ht 66.0 in | Wt 135.1 lb

## 2015-05-12 DIAGNOSIS — N390 Urinary tract infection, site not specified: Secondary | ICD-10-CM

## 2015-05-12 DIAGNOSIS — N952 Postmenopausal atrophic vaginitis: Secondary | ICD-10-CM | POA: Diagnosis not present

## 2015-05-12 DIAGNOSIS — R351 Nocturia: Secondary | ICD-10-CM | POA: Diagnosis not present

## 2015-05-12 LAB — URINALYSIS, COMPLETE
BILIRUBIN UA: NEGATIVE
GLUCOSE, UA: NEGATIVE
Ketones, UA: NEGATIVE
NITRITE UA: NEGATIVE
PH UA: 5 (ref 5.0–7.5)
Protein, UA: NEGATIVE
RBC UA: NEGATIVE
Specific Gravity, UA: <1.005 — ABNORMAL LOW (ref 1.005–1.030)
UUROB: 0.2 mg/dL (ref 0.2–1.0)

## 2015-05-12 LAB — BLADDER SCAN AMB NON-IMAGING: Scan Result: 100

## 2015-05-12 LAB — MICROSCOPIC EXAMINATION: RBC MICROSCOPIC, UA: NONE SEEN /HPF (ref 0–?)

## 2015-05-12 MED ORDER — ESTRADIOL 0.1 MG/GM VA CREA: 1.0000 | TOPICAL_CREAM | Freq: Every day | VAGINAL | Status: DC

## 2015-05-12 NOTE — Progress Notes (Signed)
05/12/2015 10:34 AM   Christine Mclean [birth year] [id number]  Referring provider: No referring provider defined for this encounter.  Chief Complaint  Patient presents with  . Recurrent UTI    referred by Dr. Lovie Macadamia    HPI: Patient is an 82 year old Caucasian female who is referred by her PCP, Dr. Lovie Macadamia, for recurrent UTI's.  Over the last year, patient has had 2 UTI's with pseudomonas and enterococcus.  The year prior she had 3 UTI's with pseudomonas and klebsiella.  She did undergo a CT with contrast in 01/2015 and no stones were visualized, kidneys were normal bilaterally and the bladder was unremarkable.  An abdominal film was taken in 01/2015 and no stones were seen on plain film.  She has had to have IV antibiotics and has been on fosfomycin, but she has to pay out of pocket for the fosfomycin.  She states she can tell when she has infections.  She just feels "really bad."  Her daughter, [name], has noticed mental status changes with the UTI's.  She has not had fevers, chills, nausea or vomiting with the infections.  She does not complain of dysuria, hematuria or suprapubic pain.  She does suffer with constipation and diarrhea.  Her UA today is unremarkable.  Her PVR is 100 cc.   Her baseline urinary symptoms consist of urinary frequency and nocturia x 3-4.  She does wear pads, but they are usually dry and she changes them to keep clean.     PMH: Past Medical History  Diagnosis Date  . Uterine cancer ([place])   . Arthritis   . Heart failure ([place])   . Heart disease   . HLD (hyperlipidemia)   . HTN (hypertension)   . Stroke (cerebrum) ([place])   . Back pain     Surgical History: Past Surgical History  Procedure Laterality Date  . Abdominal hysterectomy  1972  . Appendectomy  1950  . Skin cancer destruction  2016  . Back surgery  2016    Home Medications:    Medication List       This list is accurate as of: 05/12/15 10:34 AM.  Always use your most  recent med list.               acetaminophen 500 MG tablet  Commonly known as:  TYLENOL  Take by mouth.     alendronate 70 MG tablet  Commonly known as:  FOSAMAX  Take by mouth.     amLODipine 5 MG tablet  Commonly known as:  NORVASC  Take by mouth.     D 2000 2000 units Tabs  Generic drug:  Cholecalciferol  Take by mouth.     estradiol 0.1 MG/GM vaginal cream  Commonly known as:  ESTRACE VAGINAL  Place 1 Applicatorful vaginally at bedtime.     ferrous sulfate 325 (65 FE) MG tablet  Take by mouth.     furosemide 20 MG tablet  Commonly known as:  LASIX  Take 10 mg by mouth.     HYDROcodone-acetaminophen 5-325 MG tablet  Commonly known as:  NORCO/VICODIN  Take by mouth.     lisinopril 20 MG tablet  Commonly known as:  PRINIVIL,ZESTRIL  Take by mouth.     NITROSTAT 0.4 MG SL tablet  Generic drug:  nitroGLYCERIN  PLACE 1 TABLET  UNDER THE TONGUE EVERY 5 (FIVE) MINUTES AS NEEDED FOR CHEST PAIN. MAY TAKE UP TO 3 DOSES.     RA VITAMIN B-12 TR  1000 MCG Tbcr  Generic drug:  Cyanocobalamin  Take by mouth.     senna-docusate 8.6-50 MG tablet  Commonly known as:  Senokot-S  Take by mouth.     simvastatin 20 MG tablet  Commonly known as:  ZOCOR  Take 20 mg by mouth. Reported on 05/12/2015        Allergies:  Allergies  Allergen Reactions  . Sulfa Antibiotics Nausea And Vomiting    Family History: Family History  Problem Relation Age of Onset  . Kidney disease Neg Hx   . Bladder Cancer Neg Hx     Social History:  reports that she has never smoked. She does not have any smokeless tobacco history on file. She reports that she does not drink alcohol. Her drug history is not on file.  ROS: UROLOGY Frequent Urination?: Yes Hard to postpone urination?: No Burning/pain with urination?: No Get up at night to urinate?: Yes Leakage of urine?: Yes Urine stream starts and stops?: No Trouble starting stream?: No Do you have to strain to urinate?: No Blood in  urine?: No Urinary tract infection?: Yes Sexually transmitted disease?: No Injury to kidneys or bladder?: No Painful intercourse?: No Weak stream?: No Currently pregnant?: No Vaginal bleeding?: No Last menstrual period?: n  Gastrointestinal Nausea?: No Vomiting?: No Indigestion/heartburn?: No Diarrhea?: Yes Constipation?: Yes  Constitutional Fever: No Night sweats?: No Weight loss?: No Fatigue?: Yes  Skin Skin rash/lesions?: No Itching?: No  Eyes Blurred vision?: No Double vision?: No  Ears/Nose/Throat Sore throat?: No Sinus problems?: No  Hematologic/Lymphatic Swollen glands?: No Easy bruising?: Yes  Cardiovascular Leg swelling?: No Chest pain?: No  Respiratory Cough?: No Shortness of breath?: No  Endocrine Excessive thirst?: No  Musculoskeletal Back pain?: Yes Joint pain?: Yes  Neurological Headaches?: No Dizziness?: No  Psychologic Depression?: No Anxiety?: No  Physical Exam: BP 155/73 mmHg  Pulse 96  Ht 5\' 6"  (1.676 m)  Wt 135 lb 1.6 oz (61.281 kg)  BMI 21.82 kg/m2  Constitutional: Well nourished. Alert and oriented, No acute distress. HEENT: [place] AT, moist mucus membranes. Trachea midline, no masses. Cardiovascular: No clubbing, cyanosis, or edema. Respiratory: Normal respiratory effort, no increased work of breathing. GI: Abdomen is soft, non tender, non distended, no abdominal masses. Liver and spleen not palpable.  No hernias appreciated.  Stool sample for occult testing is not indicated.   GU: No CVA tenderness.  No bladder fullness or masses.  Atrophic external genitalia, normal pubic hair distribution, no lesions.  Normal urethral meatus, no lesions, no prolapse, no discharge.   No urethral  tenderness.  Urethral caruncle is noted.  No bladder fullness, tenderness or masses. Atrophic vagina mucosa, narrowing of the vaginal vault, poor estrogen effect, no discharge, no lesions, good pelvic support, no cystocele or rectocele noted.   Uterus and cervix are surgically absent.  No pelvic masses noted.  Anus and perineum are without rashes or lesions.    Skin: No rashes, bruises or suspicious lesions. Lymph: No cervical or inguinal adenopathy. Neurologic: Grossly intact, no focal deficits, moving all 4 extremities. Psychiatric: Normal mood and affect.  Laboratory Data: Lab Results  Component Value Date   WBC 6.7 09/06/2013   HGB 11.1* 09/06/2013   HCT 33.7* 09/06/2013   MCV 92 09/06/2013   PLT 334 09/06/2013    Lab Results  Component Value Date   CREATININE 0.73 09/06/2013     Lab Results  Component Value Date   TSH 3.10 06/01/2012     Lab Results  Component  Value Date   AST 30 01/11/2013     Urinalysis Results for orders placed or performed in visit on 05/12/15  Microscopic Examination  Result Value Ref Range   WBC, UA 0-5 0 -  5 /hpf   RBC, UA None seen 0 -  2 /hpf   Epithelial Cells (non renal) 0-10 0 - 10 /hpf   Bacteria, UA Few (A) None seen/Few  Urinalysis, Complete  Result Value Ref Range   Specific Gravity, UA <1.005 (L) 1.005 - 1.030   pH, UA 5.0 5.0 - 7.5   Color, UA Yellow Yellow   Appearance Ur Clear Clear   Leukocytes, UA 1+ (A) Negative   Protein, UA Negative Negative/Trace   Glucose, UA Negative Negative   Ketones, UA Negative Negative   RBC, UA Negative Negative   Bilirubin, UA Negative Negative   Urobilinogen, Ur 0.2 0.2 - 1.0 mg/dL   Nitrite, UA Negative Negative   Microscopic Examination See below:   BLADDER SCAN AMB NON-IMAGING  Result Value Ref Range   Scan Result 100     Pertinent Imaging: CT abdomen and pelvis with IV contrast, 02/16/2015  Comparison:  Abdomen radiograph 02/16/2015.  Indication:  S32.010G Wedge compression fracture of first lumbar vertebra, subsequent encounter for fracture with delayed healing, R10.84 Generalized abdominal pain, POST-OP PROBLEM.  Technique:  CT imaging was performed of the abdomen and pelvis following the uncomplicated  administration of intravenous contrast (Isovue-300, 150 mL at 3 mL/sec).  Iodinated contrast was used due to the indications for the examination, to improve disease detection and further define anatomy. The most recent serum creatinine is 0.9 mg/dL.   Coronal reformatted images were generated and reviewed.  Findings:  Visualized portions the lung bases demonstrates dependent opacities bilaterally, likely scarring or atelectasis. There is no pleural effusion.  There are no focal liver lesions. There is focal fatty infiltration along the falciform ligament. There is no intrahepatic or extrahepatic biliary ductal dilation. The gallbladder, pancreas, spleen, and right adrenal gland are normal. Thickening of the left adrenal gland without focal nodularity. Bilateral kidneys are normal.   Bowel is normal caliber without wall thickening. There is colonic diverticulosis without evidence of acute diverticulitis. There is no free air or free fluid within the abdomen or pelvis. No abdominal or pelvic lymphadenopathy. Bladder is unremarkable. Uterus and ovaries are not definitely seen. There is severe atherosclerotic calcifications abdominal aorta, which is normal in caliber.  There are no aggressive appearing osseous lesions. Marked changes throughout the thoracolumbar spine. Status post fixation of the right proximal femur, incompletely evaluated. Kyphoplasty at T12 with levocurvature of the lumbar spine, similar to prior radiographs. Diffuse osteopenia.  Impression: No definite etiology for abdominal pain identified.  Electronically Reviewed by:  Edgardo Roys, MD Electronically Reviewed on:  02/16/2015 11:53 AM  I have reviewed the images and concur with the above findings.  Electronically Signed by:  Scarlette Shorts, MD Electronically Signed on:  02/16/2015 12:04 PM    X-ray abdomen flat and upright10/23/2016  [place]  Result Narrative  XR ABDOMEN FLAT  AND UPRIGHT   INDICATION: S32.010G Wedge compression fracture of first lumbar vertebra, subsequent encounter for fracture with delayed healing, R10.84 Generalized abdominal pain, POST-OP PROBLEM  COMPARISON: Same day CT abdomen pelvis.  FINDINGS: Generalized osteopenia. Status post right femoral ORIF. Lumbar compression deformities better characterized on contemporaneous lumbar spine series. Normal caliber small and large bowel with stool throughout the colon. Lung bases clear.  IMPRESSION: Nonobstructive bowel gas pattern.  The preliminary report (critical or emergent communication) was reviewed prior to this dictation and there are no substantial differences between the preliminary results and the impressions in this final report.     Electronically Reviewed by:  Pete Glatter, MD Electronically Reviewed on:  02/16/2015 5:34 PM  I have reviewed the images and concur with the above findings.  Electronically Signed by:  Edgardo Roys, MD Electronically Signed on:  02/16/2015 7:09 PM   Results for JOBY, SEIFFERT (MRN [id number]) as of 05/12/2015 10:34  Ref. Range 05/12/2015 10:04  Scan Result Unknown 100     Assessment & Plan:    1. Recurrent UTI's:   Patient has had two UTI's over the last year and three the previous year.  She has had to be on IV antibiotics and fosfomycin.  Her UA was unremarkable on today's exam.  She was found to have vaginal narrowing and atrophy on today's exam.  She will be started on vaginal estrogen cream at this time.  Patient will RTC in 3 months for an exam and symptom recheck.  She will contact our office for any UTI symptoms in the interim.    2. Atrophic vaginitis:  Patient was given a sample of vaginal estrogen cream and instructed to apply 0.5mg  (pea-sized amount)  just inside the vaginal introitus with a finger-tip every night for two weeks and then Monday, Wednesday and Friday nights.  I explained to the patient that vaginally  administered estrogen, which causes only a slight increase in the blood estrogen levels, have fewer contraindications and adverse systemic effects that oral HT.  She will RTC in 3 months for an exam and symptom recheck.  3. Nocturia:   I explained to the patient that nocturia is often multi-factorial and difficult to treat.  Sleeping disorders, heart conditions and peripheral vascular disease, diabetes,  enlarged prostate or urethral stricture causing bladder outlet obstruction and/or certain medications.  I have suggested that the patient avoid caffeine and alcohol in the evening. She may also benefit from fluid restrictions after 6:00 in the evening and voiding just prior to bedtime.  The patient may also benefit from a discussion with his primary care physician to see if he has risk factors for sleep apnea or other sleep disturbances and obtaining a sleep study.   Return in about 3 months (around 08/10/2015) for exam and PVR .  These notes generated with voice recognition software. I apologize for typographical errors.  Zara Council, [facility] [address] [place], [place] [address] [phone]

## 2015-08-13 ENCOUNTER — Ambulatory Visit: Payer: Medicare Other | Admitting: Urology

## 2015-08-18 ENCOUNTER — Encounter: Payer: Self-pay | Admitting: Urology

## 2015-08-18 ENCOUNTER — Ambulatory Visit (INDEPENDENT_AMBULATORY_CARE_PROVIDER_SITE_OTHER): Payer: Medicare Other | Admitting: Urology

## 2015-08-18 VITALS — BP 152/76 | HR 89 | Ht 66.0 in | Wt 138.0 lb

## 2015-08-18 DIAGNOSIS — R351 Nocturia: Secondary | ICD-10-CM | POA: Diagnosis not present

## 2015-08-18 DIAGNOSIS — N390 Urinary tract infection, site not specified: Secondary | ICD-10-CM | POA: Diagnosis not present

## 2015-08-18 DIAGNOSIS — K922 Gastrointestinal hemorrhage, unspecified: Secondary | ICD-10-CM | POA: Insufficient documentation

## 2015-08-18 DIAGNOSIS — N952 Postmenopausal atrophic vaginitis: Secondary | ICD-10-CM | POA: Diagnosis not present

## 2015-08-18 LAB — BLADDER SCAN AMB NON-IMAGING

## 2015-08-18 MED ORDER — ESTRADIOL 0.1 MG/GM VA CREA: TOPICAL_CREAM | VAGINAL | Status: DC

## 2015-08-18 NOTE — Progress Notes (Signed)
3:46 PM   Christine Mclean [birth year] [id number]  Referring provider: Juluis Pitch, MD [address] [place],  [address]  Chief Complaint  Patient presents with  . Recurrent UTI    HPI: Patient is an 82 year old Caucasian female with a history of recurrent UTI's, atrophic vaginitis and nocturia who presents today for a 3 month follow-up.  History of recurrent UTI's Patient was referred by her PCP, Dr. Lovie Macadamia, for recurrent UTI's.  Over the last year, patient has had 2 UTI's with pseudomonas and enterococcus.  The year prior she had 3 UTI's with pseudomonas and klebsiella.  She did undergo a CT with contrast in 01/2015 and no stones were visualized, kidneys were normal bilaterally and the bladder was unremarkable.  An abdominal film was taken in 01/2015 and no stones were seen on plain film.  She has had to have IV antibiotics and has been on fosfomycin, but she has to pay out of pocket for the fosfomycin.   She states she can tell when she has infections.  She just feels "really bad."  Her daughter, [name], has noticed mental status changes with the UTI's.  She has not had fevers, chills, nausea or vomiting with the infections. She does suffer with constipation and diarrhea.She does state that on April 20 she was experiencing symptoms of a urinary tract infection and was initiated on Macrobid twice a day.  A urine culture was ordered but not performed.   She did not have fevers, chills, nausea or vomiting.  She does not complain of dysuria, hematuria or suprapubic pain.  Her UA today is unremarkable.  Her PVR is 176 cc.   Atrophic vaginitis Patient is using her vaginal estrogen cream 3 nights weekly. She is not experiencing vaginal irritation or rash.  She feels that she is experiencing an improvement in the suppleness of the vaginal tissue.  Nocturia Her baseline urinary symptoms consist of urinary frequency and nocturia x 3-4.  She does wear pads, but they are  usually dry and she changes them to keep clean.  Patient is applying dietary measures, but she has not been evaluated for sleep apnea.   PMH: Past Medical History  Diagnosis Date  . Uterine cancer ([place])   . Arthritis   . Heart failure ([place])   . Heart disease   . HLD (hyperlipidemia)   . HTN (hypertension)   . Stroke (cerebrum) ([place])   . Back pain     Surgical History: Past Surgical History  Procedure Laterality Date  . Abdominal hysterectomy  1972  . Appendectomy  1950  . Skin cancer destruction  2016  . Back surgery  2016    Home Medications:    Medication List       This list is accurate as of: 08/18/15  3:46 PM.  Always use your most recent med list.               acetaminophen 500 MG tablet  Commonly known as:  TYLENOL  Take by mouth.     alendronate 70 MG tablet  Commonly known as:  FOSAMAX  Take by mouth.     amLODipine 5 MG tablet  Commonly known as:  NORVASC  Take by mouth.     D 2000 2000 units Tabs  Generic drug:  Cholecalciferol  Take by mouth.     estradiol 0.1 MG/GM vaginal cream  Commonly known as:  ESTRACE VAGINAL  Apply 0.5mg  (pea-sized amount)  just inside the vaginal introitus  with a finger-tip every night for two weeks and then Monday, Wednesday and Friday nights.     ferrous sulfate 325 (65 FE) MG tablet  Take by mouth.     furosemide 20 MG tablet  Commonly known as:  LASIX  Take 10 mg by mouth.     HYDROcodone-acetaminophen 5-325 MG tablet  Commonly known as:  NORCO/VICODIN  Take by mouth.     lisinopril 20 MG tablet  Commonly known as:  PRINIVIL,ZESTRIL  Take by mouth.     nitrofurantoin (macrocrystal-monohydrate) 100 MG capsule  Commonly known as:  MACROBID     NITROSTAT 0.4 MG SL tablet  Generic drug:  nitroGLYCERIN  PLACE 1 TABLET  UNDER THE TONGUE EVERY 5 (FIVE) MINUTES AS NEEDED FOR CHEST PAIN. MAY TAKE UP TO 3 DOSES.     RA VITAMIN B-12 TR 1000 MCG Tbcr  Generic drug:  Cyanocobalamin  Take by mouth.      senna-docusate 8.6-50 MG tablet  Commonly known as:  Senokot-S  Take by mouth.     simvastatin 20 MG tablet  Commonly known as:  ZOCOR  Take 20 mg by mouth. Reported on 05/12/2015        Allergies:  Allergies  Allergen Reactions  . Sulfa Antibiotics Nausea And Vomiting    Family History: Family History  Problem Relation Age of Onset  . Kidney disease Neg Hx   . Bladder Cancer Neg Hx     Social History:  reports that she has never smoked. She does not have any smokeless tobacco history on file. She reports that she does not drink alcohol. Her drug history is not on file.  ROS: UROLOGY Frequent Urination?: No Hard to postpone urination?: No Burning/pain with urination?: No Get up at night to urinate?: No Leakage of urine?: No Urine stream starts and stops?: No Trouble starting stream?: No Do you have to strain to urinate?: No Blood in urine?: No Urinary tract infection?: No Sexually transmitted disease?: No Injury to kidneys or bladder?: No Painful intercourse?: No Weak stream?: No Currently pregnant?: No Vaginal bleeding?: No Last menstrual period?: n  Gastrointestinal Nausea?: No Vomiting?: No Indigestion/heartburn?: No Diarrhea?: Yes Constipation?: Yes  Constitutional Fever: No Night sweats?: No Weight loss?: No Fatigue?: Yes  Skin Skin rash/lesions?: No Itching?: No  Eyes Blurred vision?: No Double vision?: No  Ears/Nose/Throat Sore throat?: No Sinus problems?: No  Hematologic/Lymphatic Swollen glands?: No Easy bruising?: Yes  Cardiovascular Leg swelling?: Yes Chest pain?: No  Respiratory Cough?: Yes Shortness of breath?: No  Endocrine Excessive thirst?: No  Musculoskeletal Back pain?: Yes Joint pain?: Yes  Neurological Headaches?: No Dizziness?: No  Psychologic Depression?: No Anxiety?: No  Physical Exam: BP 152/76 mmHg  Pulse 89  Ht 5\' 6"  (1.676 m)  Wt 138 lb (62.596 kg)  BMI 22.28 kg/m2  Constitutional: Well  nourished. Alert and oriented, No acute distress. HEENT: [place] AT, moist mucus membranes. Trachea midline, no masses. Cardiovascular: No clubbing, cyanosis, or edema. Respiratory: Normal respiratory effort, no increased work of breathing. GI: Abdomen is soft, non tender, non distended, no abdominal masses. Liver and spleen not palpable.  No hernias appreciated.  Stool sample for occult testing is not indicated.   GU: No CVA tenderness.  No bladder fullness or masses.  Atrophic external genitalia, normal pubic hair distribution, no lesions.  Normal urethral meatus, no lesions, no prolapse, no discharge.   No urethral  tenderness.  Urethral caruncle is noted.  No bladder fullness, tenderness or masses. Atrophic vagina  mucosa, narrowing of the vaginal vault, poor estrogen effect, no discharge, no lesions, good pelvic support, no cystocele or rectocele noted.  Uterus and cervix are surgically absent.  No pelvic masses noted.  Anus and perineum are without rashes or lesions.    Skin: No rashes, bruises or suspicious lesions. Lymph: No cervical or inguinal adenopathy. Neurologic: Grossly intact, no focal deficits, moving all 4 extremities. Psychiatric: Normal mood and affect.  Laboratory Data: Lab Results  Component Value Date   WBC 6.7 09/06/2013   HGB 11.1* 09/06/2013   HCT 33.7* 09/06/2013   MCV 92 09/06/2013   PLT 334 09/06/2013    Lab Results  Component Value Date   CREATININE 0.73 09/06/2013     Lab Results  Component Value Date   TSH 3.10 06/01/2012     Lab Results  Component Value Date   AST 30 01/11/2013     Urinalysis Results for orders placed or performed in visit on 08/18/15  Microscopic Examination  Result Value Ref Range   WBC, UA 0-5 0 -  5 /hpf   RBC, UA 0-2 0 -  2 /hpf   Epithelial Cells (non renal) 0-10 0 - 10 /hpf   Bacteria, UA None seen None seen/Few  Urinalysis, Complete  Result Value Ref Range   Specific Gravity, UA <1.005 (L) 1.005 - 1.030   pH, UA  5.0 5.0 - 7.5   Color, UA Yellow Yellow   Appearance Ur Clear Clear   Leukocytes, UA Negative Negative   Protein, UA Negative Negative/Trace   Glucose, UA Negative Negative   Ketones, UA Negative Negative   RBC, UA Trace (A) Negative   Bilirubin, UA Negative Negative   Urobilinogen, Ur 0.2 0.2 - 1.0 mg/dL   Nitrite, UA Negative Negative   Microscopic Examination See below:   BLADDER SCAN AMB NON-IMAGING  Result Value Ref Range   Scan Result 12ml    Pertinent Imaging:  Results for KYLIN, BORSA (MRN [id number]) as of 08/24/2015 15:25  Ref. Range 08/18/2015 15:24  Scan Result Unknown 173ml      Assessment & Plan:    1. Recurrent UTI's:   Patient has had two UTI's over the last year and three the previous year.  She has had to be on IV antibiotics and fosfomycin.  He feels she may have had an infection on 08/14/2015, but a urine culture was not sent. She will return in 2 weeks for a repeat UA and urine culture to confirm clearance of infection.   Her UA was unremarkable on today's exam.  She will contact our office for any UTI symptoms in the interim.    2. Atrophic vaginitis:  Patient will continue the vaginal estrogen cream 3 nights weekly. She has an upcoming UA and urine culture. The results of those laboratory tests will determine when we will see her next.   3. Nocturia:   She will continue dietary measures.  She has an upcoming UA and urine culture. The results of those laboratory tests will determine when we will see her next.      Return in about 2 weeks (around 09/01/2015) for UA and Urine Culture only.  These notes generated with voice recognition software. I apologize for typographical errors.  Zara Council, [facility] [address] [place], [place] [address] [phone]

## 2015-08-19 LAB — MICROSCOPIC EXAMINATION: Bacteria, UA: NONE SEEN

## 2015-08-19 LAB — URINALYSIS, COMPLETE
Bilirubin, UA: NEGATIVE
GLUCOSE, UA: NEGATIVE
Ketones, UA: NEGATIVE
Leukocytes, UA: NEGATIVE
NITRITE UA: NEGATIVE
Protein, UA: NEGATIVE
Specific Gravity, UA: <1.005 — ABNORMAL LOW (ref 1.005–1.030)
UUROB: 0.2 mg/dL (ref 0.2–1.0)
pH, UA: 5 (ref 5.0–7.5)

## 2015-09-01 ENCOUNTER — Other Ambulatory Visit: Payer: Medicare Other

## 2015-09-01 ENCOUNTER — Encounter: Payer: Self-pay | Admitting: Urology

## 2015-09-01 DIAGNOSIS — N390 Urinary tract infection, site not specified: Secondary | ICD-10-CM

## 2015-09-01 LAB — MICROSCOPIC EXAMINATION: RBC, UA: NONE SEEN /hpf (ref 0–?)

## 2015-09-01 LAB — URINALYSIS, COMPLETE
Bilirubin, UA: NEGATIVE
GLUCOSE, UA: NEGATIVE
Ketones, UA: NEGATIVE
Leukocytes, UA: NEGATIVE
Nitrite, UA: NEGATIVE
PH UA: 7 (ref 5.0–7.5)
PROTEIN UA: NEGATIVE
RBC, UA: NEGATIVE
Specific Gravity, UA: 1.015 (ref 1.005–1.030)
Urobilinogen, Ur: 0.2 mg/dL (ref 0.2–1.0)

## 2015-09-03 LAB — CULTURE, URINE COMPREHENSIVE

## 2015-09-04 ENCOUNTER — Telehealth: Payer: Self-pay

## 2015-09-04 NOTE — Telephone Encounter (Signed)
Patient notified and voiced understanding.

## 2015-09-04 NOTE — Telephone Encounter (Signed)
No answer at [phone].

## 2015-09-04 NOTE — Telephone Encounter (Signed)
-----   Message from Nori Riis, PA-C sent at 09/03/2015  9:08 PM EDT ----- Please let the patient know that her urine culture is negative.  We will see her in 1 year.  If she should have symptoms of urinary tract infection in the interim, I would like her to contact our office to bringing in a urine sample for UA and culture.

## 2015-10-03 ENCOUNTER — Telehealth: Payer: Self-pay | Admitting: Urology

## 2015-10-03 ENCOUNTER — Telehealth: Payer: Self-pay

## 2015-10-03 DIAGNOSIS — N390 Urinary tract infection, site not specified: Secondary | ICD-10-CM

## 2015-10-03 MED ORDER — CIPROFLOXACIN HCL 500 MG PO TABS: 500.0000 mg | ORAL_TABLET | Freq: Two times a day (BID) | ORAL | Status: DC

## 2015-10-03 NOTE — Telephone Encounter (Signed)
Spoke with pt daughter who stated pt went to see GI,Dr. Marlane Mingle, yesterday and a urine sample was taken. Per daughter pt feels really bad described as increased frequency of urination. Daughter also stated that pt looks at though she can hardly move. Reinforced with daughter that we need ucx results before we can even possibly do anything for pt. Daughter requested an abx be sent to her pharmacy. Again reinforced with pt an abx can not be sent into the pharmacy until we see ucx results as we have not seen her in several months. Daughter voiced understanding.

## 2015-10-03 NOTE — Telephone Encounter (Signed)
Pt's daughter called asking about mom's test results

## 2015-10-03 NOTE — Telephone Encounter (Signed)
Per Dr. Erlene Quan pt was given cipro 500mg  bid x3 days. Spoke with pt daughter again in reference to pt feeling bad and u/a done at GI. Daughter stated that the GI physician did not run a ucx. Reinforced with daughter that if she does not want to bring pt to [facility] then she needs to take pt back to the GI/urgent care facility prior to starting abx given. Daughter voiced understanding of whole conversation. Also made daughter aware [facility] will need ucx results.

## 2015-10-07 ENCOUNTER — Encounter: Payer: Self-pay | Admitting: Urology

## 2015-10-07 ENCOUNTER — Ambulatory Visit (INDEPENDENT_AMBULATORY_CARE_PROVIDER_SITE_OTHER): Payer: Medicare Other | Admitting: Urology

## 2015-10-07 VITALS — BP 162/61 | HR 51 | Ht 67.0 in | Wt 139.5 lb

## 2015-10-07 DIAGNOSIS — N390 Urinary tract infection, site not specified: Secondary | ICD-10-CM

## 2015-10-07 DIAGNOSIS — N952 Postmenopausal atrophic vaginitis: Secondary | ICD-10-CM

## 2015-10-07 DIAGNOSIS — R351 Nocturia: Secondary | ICD-10-CM | POA: Diagnosis not present

## 2015-10-07 LAB — BLADDER SCAN AMB NON-IMAGING: Scan Result: 174

## 2015-10-07 MED ORDER — ESTRADIOL 0.1 MG/GM VA CREA: TOPICAL_CREAM | VAGINAL | Status: DC

## 2015-10-07 NOTE — Progress Notes (Signed)
11:25 AM   Demaris Callander [birth year] [id number]  Referring provider: Juluis Pitch, MD [phone] [place], [place] [address]  Chief Complaint  Patient presents with  . Recurrent UTI    patient states abx not working    HPI: Patient is an 82 year old Caucasian female who presents today for symptoms of weakness and feeling terrible after a course of ciprofloxacin for a presumptive urinary tract infection.  Patient's was having this feeling of weakness, body achiness and shortness of breath a week for an appointment with her GI office. She states she was told at that appointment that her urine looked infected.  The UA from that visit was positive for greater than 50 bacteria and 33 WBCs per high-power field. A culture was not sent.  Because her symptoms were described as severe by her daughter, a 3 day course of Cipro was prescribed for the patient.  A urine culture performed on a urinalysis 2 days later was negative.  She does state that she is feeling better today.  Her PVR is 174 mL.  She states that she has not noted much improvement in her weakness and body achiness.  She has a history of recurrent UTI's, atrophic vaginitis and nocturia.    History of recurrent UTI's Patient was referred by her PCP, Dr. Lovie Macadamia, for recurrent UTI's.  Over the last year, patient has had 2 UTI's with pseudomonas and enterococcus.  The year prior she had 3 UTI's with pseudomonas and klebsiella.  She did undergo a CT with contrast in 01/2015 and no stones were visualized, kidneys were normal bilaterally and the bladder was unremarkable.  An abdominal film was taken in 01/2015 and no stones were seen on plain film.  She has had to have IV antibiotics and has been on fosfomycin, but she has to pay out of pocket for the fosfomycin.   She states she can tell when she has infections.  She just feels "really bad."  Her daughter, [name], has noticed mental status changes with the UTI's.  She has  not had fevers, chills, nausea or vomiting with the infections. She does suffer with constipation and diarrhea.  Atrophic vaginitis Patient is using her vaginal estrogen cream 3 nights weekly. She is not experiencing vaginal irritation or rash.  She feels that she is experiencing an improvement in the suppleness of the vaginal tissue.  Nocturia Her baseline urinary symptoms consist of urinary frequency and nocturia x 3-4.  She does wear pads, but they are usually dry and she changes them to keep clean.  Patient is applying dietary measures, but she has not been evaluated for sleep apnea.   PMH: Past Medical History  Diagnosis Date  . Uterine cancer ([place])   . Arthritis   . Heart failure ([place])   . Heart disease   . HLD (hyperlipidemia)   . HTN (hypertension)   . Stroke (cerebrum) ([place])   . Back pain   . Recurrent UTI     Surgical History: Past Surgical History  Procedure Laterality Date  . Abdominal hysterectomy  1972  . Appendectomy  1950  . Skin cancer destruction  2016  . Back surgery  2016    Home Medications:    Medication List       This list is accurate as of: 10/07/15 11:25 AM.  Always use your most recent med list.               acetaminophen 500 MG tablet  Commonly  known as:  TYLENOL  Take by mouth.     alendronate 70 MG tablet  Commonly known as:  FOSAMAX  Take by mouth.     amLODipine 5 MG tablet  Commonly known as:  NORVASC  Take by mouth.     ciprofloxacin 500 MG tablet  Commonly known as:  CIPRO  Take 1 tablet (500 mg total) by mouth 2 (two) times daily.     D 2000 2000 units Tabs  Generic drug:  Cholecalciferol  Take by mouth.     estradiol 0.1 MG/GM vaginal cream  Commonly known as:  ESTRACE VAGINAL  Apply 0.5mg  (pea-sized amount)  just inside the vaginal introitus with a finger-tip every night for two weeks and then Monday, Wednesday and Friday nights.     ferrous sulfate 325 (65 FE) MG tablet  Take by mouth.     furosemide 20 MG  tablet  Commonly known as:  LASIX  Take 10 mg by mouth.     HYDROcodone-acetaminophen 5-325 MG tablet  Commonly known as:  NORCO/VICODIN  Take by mouth. Reported on 10/07/2015     lisinopril 20 MG tablet  Commonly known as:  PRINIVIL,ZESTRIL  Take by mouth.     nitrofurantoin (macrocrystal-monohydrate) 100 MG capsule  Commonly known as:  MACROBID  Reported on 10/07/2015     NITROSTAT 0.4 MG SL tablet  Generic drug:  nitroGLYCERIN  PLACE 1 TABLET  UNDER THE TONGUE EVERY 5 (FIVE) MINUTES AS NEEDED FOR CHEST PAIN. MAY TAKE UP TO 3 DOSES.     RA VITAMIN B-12 TR 1000 MCG Tbcr  Generic drug:  Cyanocobalamin  Take by mouth.     senna-docusate 8.6-50 MG tablet  Commonly known as:  Senokot-S  Take by mouth.     simvastatin 20 MG tablet  Commonly known as:  ZOCOR  Take 20 mg by mouth. Reported on 05/12/2015        Allergies:  Allergies  Allergen Reactions  . Sulfa Antibiotics Nausea And Vomiting    Family History: Family History  Problem Relation Age of Onset  . Kidney disease Neg Hx   . Bladder Cancer Neg Hx     Social History:  reports that she has never smoked. She does not have any smokeless tobacco history on file. She reports that she does not drink alcohol. Her drug history is not on file.  ROS: UROLOGY Frequent Urination?: No Hard to postpone urination?: Yes Burning/pain with urination?: No Get up at night to urinate?: Yes Leakage of urine?: No Urine stream starts and stops?: No Trouble starting stream?: No Do you have to strain to urinate?: Yes Blood in urine?: No Urinary tract infection?: Yes Sexually transmitted disease?: No Injury to kidneys or bladder?: No Painful intercourse?: No Weak stream?: No Currently pregnant?: No Vaginal bleeding?: No Last menstrual period?: n  Gastrointestinal Nausea?: No Vomiting?: No Indigestion/heartburn?: No Diarrhea?: Yes Constipation?: No  Constitutional Fever: No Night sweats?: No Weight loss?:  No Fatigue?: Yes  Skin Skin rash/lesions?: No Itching?: No  Eyes Blurred vision?: No Double vision?: No  Ears/Nose/Throat Sore throat?: No Sinus problems?: No  Hematologic/Lymphatic Swollen glands?: No Easy bruising?: No  Cardiovascular Leg swelling?: Yes Chest pain?: No  Respiratory Cough?: No Shortness of breath?: No  Endocrine Excessive thirst?: No  Musculoskeletal Back pain?: No Joint pain?: No  Neurological Headaches?: No Dizziness?: No  Psychologic Depression?: No Anxiety?: No  Physical Exam: BP 162/61 mmHg  Pulse 51  Ht 5\' 7"  (1.702 m)  Wt 139  lb 8 oz (63.277 kg)  BMI 21.84 kg/m2  Constitutional: Well nourished. Alert and oriented, No acute distress. HEENT: [place] AT, moist mucus membranes. Trachea midline, no masses. Cardiovascular: No clubbing, cyanosis, or edema. Respiratory: Normal respiratory effort, no increased work of breathing. Skin: No rashes, bruises or suspicious lesions. Lymph: No cervical or inguinal adenopathy. Neurologic: Grossly intact, no focal deficits, moving all 4 extremities. Psychiatric: Normal mood and affect.  Laboratory Data: Lab Results  Component Value Date   WBC 6.7 09/06/2013   HGB 11.1* 09/06/2013   HCT 33.7* 09/06/2013   MCV 92 09/06/2013   PLT 334 09/06/2013    Lab Results  Component Value Date   CREATININE 0.73 09/06/2013     Lab Results  Component Value Date   TSH 3.10 06/01/2012     Lab Results  Component Value Date   AST 30 01/11/2013     Pertinent imaging Results for THALEIA, COZENS (MRN [id number]) as of 10/09/2015 14:01  Ref. Range 10/07/2015 11:35  Scan Result Unknown 174      Assessment & Plan:    1. Recurrent UTI's:   Patient has had two UTI's over the last year and three the previous year.  She has had to be on IV antibiotics and fosfomycin.  Patient is instructed to increase her water intake until the urine is pale yellow or clear.  I have advised her to take  probiotics (yogurt, oral pills or vaginal suppositories), take cranberry pills or drink the juice and use the estrogen cream.  She is to take Vitamin C 1,000 mg daily to acidify the urine. She should also avoid soaking in tubs and wipe front to back after urinating.  She may benefit from core strengthening exercises.  We can refer her to PT if she desires.    Because of her history of recurrent UTIs, I have asked the patient to contact our office if she should experience symptoms of urinary tract infection so that we can CATH her for an urine specimen for urinalysis and culture. This is to prevent a skin contaminant from showing up in the urine culture.  If she should have her symptoms after hours or cannot get to our office, she should notify her other providers that she needs a catheterized specimen for UA and culture.   I reviewed the symptoms of a urinary tract infection, such as a worsening of urinary urgency and frequency, dysuria, which is painful urination and not the pain of urine hitting sensitive perineal skin, hematuria, foul-smelling urine, suprapubic pain or mental status changes. Fevers, chills, nausea and or vomiting can also be signs of a possible UTI.  Positive urinalyses and positive urine cultures that are not associated with urinary symptoms should not be treated with antibiotics.    I explained to the patient that being exposed to unnecessary antibiotics can put her at risk for increasing resistance of the bacteria to antibiotics, C. difficile and the side effects of the antibiotics.    She will return next week for a catheterized specimen since she has just finished her Cipro prescription yesterday morning.  She will also undergo a RUS and KUB study.    2. Atrophic vaginitis:  Patient will continue the vaginal estrogen cream 3 nights weekly. She has an upcoming UA and urine culture. The results of those laboratory tests will determine when we will see her next.   3. Nocturia:   She  will continue dietary measures.  She has an upcoming UA and urine  culture.    Return for RUS and KUB report.  These notes generated with voice recognition software. I apologize for typographical errors.  Zara Council, [facility] [address] [place], [place] [address] [phone]

## 2015-10-10 ENCOUNTER — Ambulatory Visit (INDEPENDENT_AMBULATORY_CARE_PROVIDER_SITE_OTHER): Payer: Medicare Other

## 2015-10-10 DIAGNOSIS — N390 Urinary tract infection, site not specified: Secondary | ICD-10-CM

## 2015-10-10 LAB — URINALYSIS, COMPLETE
Bilirubin, UA: NEGATIVE
GLUCOSE, UA: NEGATIVE
Ketones, UA: NEGATIVE
Leukocytes, UA: NEGATIVE
NITRITE UA: NEGATIVE
Protein, UA: NEGATIVE
Specific Gravity, UA: 1.01 (ref 1.005–1.030)
UUROB: 0.2 mg/dL (ref 0.2–1.0)
pH, UA: 6.5 (ref 5.0–7.5)

## 2015-10-10 LAB — MICROSCOPIC EXAMINATION
Bacteria, UA: NONE SEEN
WBC UA: NONE SEEN /HPF (ref 0–?)

## 2015-10-10 NOTE — Progress Notes (Signed)
In and Out Catheterization  Patient is present today for a I & O catheterization due to recurrent UTI. Patient was cleaned and prepped in a sterile fashion with betadine and Lidocaine 2% jelly was instilled into the urethra.  A 14FR cath was inserted no complications were noted , 200ml of urine return was noted, urine was yellow in color. A clean urine sample was collected for u/a and cx. Bladder was drained  And catheter was removed with out difficulty.    Preformed by: Lajarvis Italiano, LPN   

## 2015-10-12 LAB — CULTURE, URINE COMPREHENSIVE

## 2015-10-13 ENCOUNTER — Ambulatory Visit
Admission: RE | Admit: 2015-10-13 | Discharge: 2015-10-13 | Disposition: A | Discharge status: Home / Self Care | Payer: Medicare Other | Source: Ambulatory Visit | Attending: Urology | Admitting: Urology

## 2015-10-13 ENCOUNTER — Telehealth: Payer: Self-pay

## 2015-10-13 DIAGNOSIS — N390 Urinary tract infection, site not specified: Secondary | ICD-10-CM | POA: Insufficient documentation

## 2015-10-13 NOTE — Telephone Encounter (Signed)
-----   Message from Nori Riis, PA-C sent at 10/12/2015  8:07 PM EDT ----- Urine culture is negative.

## 2015-10-13 NOTE — Telephone Encounter (Signed)
Spoke with pt daughter in reference to -ucx. Daughter voiced understanding.

## 2015-10-27 ENCOUNTER — Telehealth: Payer: Self-pay | Admitting: Urology

## 2015-10-27 ENCOUNTER — Ambulatory Visit (INDEPENDENT_AMBULATORY_CARE_PROVIDER_SITE_OTHER): Payer: Medicare Other | Admitting: Urology

## 2015-10-27 ENCOUNTER — Encounter: Payer: Self-pay | Admitting: Urology

## 2015-10-27 VITALS — BP 134/73 | HR 85 | Ht 66.0 in | Wt 138.1 lb

## 2015-10-27 DIAGNOSIS — N952 Postmenopausal atrophic vaginitis: Secondary | ICD-10-CM | POA: Diagnosis not present

## 2015-10-27 DIAGNOSIS — N390 Urinary tract infection, site not specified: Secondary | ICD-10-CM

## 2015-10-27 DIAGNOSIS — R351 Nocturia: Secondary | ICD-10-CM | POA: Diagnosis not present

## 2015-10-27 NOTE — Telephone Encounter (Signed)
Would you please send a copy of this office visit to the patient's PCP, Dr. Pauline Good?

## 2015-10-27 NOTE — Progress Notes (Signed)
3:01 PM   Denine Plouff 1927/01/24 [id number]  Referring provider: Juluis Pitch, MD [phone] [address] [place], [place] [address]  Chief Complaint  Patient presents with  . Results    HPI: Patient is an 82 year old Caucasian female who presents today for renal ultrasound and KUB results. symptoms of weakness and feeling terrible after a course of ciprofloxacin for a presumptive urinary tract infection.  History of recurrent UTI's Patient was referred by her PCP, Dr. Lovie Macadamia, for recurrent UTI's.  Over the last year, patient has had 2 UTI's with pseudomonas and enterococcus.  The year prior she had 3 UTI's with pseudomonas and klebsiella.  She did undergo a CT with contrast in 01/2015 and no stones were visualized, kidneys were normal bilaterally and the bladder was unremarkable.  An abdominal film was taken in 01/2015 and no stones were seen on plain film.  She has had to have IV antibiotics and has been on fosfomycin, but she has to pay out of pocket for the fosfomycin.  She states she can tell when she has infections.  She just feels "really bad."  Her daughter, [name], has noticed mental status changes with the UTI's.  She has not had fevers, chills, nausea or vomiting with the infections. She does suffer with constipation and diarrhea.  She is currently asymptomatic at this time.  Renal ultrasound performed on 10/13/2015 was normal. KUB performed on 10/13/2015 did not demonstrate any nephrolithiasis. I reviewed the films with the patient.  Atrophic vaginitis Patient is using her vaginal estrogen cream 3 nights weekly. She is not experiencing vaginal irritation or rash.  She feels that she is experiencing an improvement in the suppleness of the vaginal tissue.  She will continue the vaginal estrogen cream 3 nights weekly.  Nocturia Her baseline urinary symptoms consist of urinary frequency and nocturia x 3-4.  She does wear pads, but they are usually dry and she changes them  to keep clean.  Patient is applying dietary measures, but she has not been evaluated for sleep apnea.  Patient states that she is still drinking fluids into the evening. She also drinks coffee in the afternoon, but she states that's decaffeinated.   PMH: Past Medical History  Diagnosis Date  . Uterine cancer ([place])   . Arthritis   . Heart failure ([place])   . Heart disease   . HLD (hyperlipidemia)   . HTN (hypertension)   . Stroke (cerebrum) ([place])   . Back pain   . Recurrent UTI     Surgical History: Past Surgical History  Procedure Laterality Date  . Abdominal hysterectomy  1972  . Appendectomy  1950  . Skin cancer destruction  2016  . Back surgery  2016    Home Medications:    Medication List       This list is accurate as of: 10/27/15  3:01 PM.  Always use your most recent med list.               acetaminophen 500 MG tablet  Commonly known as:  TYLENOL  Take by mouth.     alendronate 70 MG tablet  Commonly known as:  FOSAMAX  Take by mouth.     amLODipine 5 MG tablet  Commonly known as:  NORVASC  Take by mouth.     ciprofloxacin 500 MG tablet  Commonly known as:  CIPRO  Take 1 tablet (500 mg total) by mouth 2 (two) times daily.     D 2000 2000  units Tabs  Generic drug:  Cholecalciferol  Take by mouth.     estradiol 0.1 MG/GM vaginal cream  Commonly known as:  ESTRACE VAGINAL  Apply 0.5mg  (pea-sized amount)  just inside the vaginal introitus with a finger-tip every night for two weeks and then Monday, Wednesday and Friday nights.     ferrous sulfate 325 (65 FE) MG tablet  Take by mouth.     furosemide 20 MG tablet  Commonly known as:  LASIX  Take 10 mg by mouth.     HYDROcodone-acetaminophen 5-325 MG tablet  Commonly known as:  NORCO/VICODIN  Take by mouth. Reported on 10/07/2015     nitrofurantoin (macrocrystal-monohydrate) 100 MG capsule  Commonly known as:  MACROBID  Reported on 10/07/2015     NITROSTAT 0.4 MG SL tablet  Generic drug:   nitroGLYCERIN  PLACE 1 TABLET  UNDER THE TONGUE EVERY 5 (FIVE) MINUTES AS NEEDED FOR CHEST PAIN. MAY TAKE UP TO 3 DOSES.     RA VITAMIN B-12 TR 1000 MCG Tbcr  Generic drug:  Cyanocobalamin  Take by mouth.     senna-docusate 8.6-50 MG tablet  Commonly known as:  Senokot-S  Take by mouth.     simvastatin 20 MG tablet  Commonly known as:  ZOCOR  Take 20 mg by mouth. Reported on 05/12/2015     valsartan 40 MG tablet  Commonly known as:  DIOVAN  Take by mouth.        Allergies:  Allergies  Allergen Reactions  . Sulfa Antibiotics Nausea And Vomiting    Family History: Family History  Problem Relation Age of Onset  . Kidney disease Neg Hx   . Bladder Cancer Neg Hx     Social History:  reports that she has never smoked. She does not have any smokeless tobacco history on file. She reports that she does not drink alcohol. Her drug history is not on file.  ROS: UROLOGY Frequent Urination?: Yes Hard to postpone urination?: Yes Burning/pain with urination?: No Get up at night to urinate?: Yes Leakage of urine?: No Urine stream starts and stops?: No Trouble starting stream?: No Do you have to strain to urinate?: No Blood in urine?: No Urinary tract infection?: No Sexually transmitted disease?: No Injury to kidneys or bladder?: No Painful intercourse?: No Weak stream?: No Currently pregnant?: No Vaginal bleeding?: No Last menstrual period?: n  Gastrointestinal Nausea?: No Vomiting?: No Indigestion/heartburn?: No Diarrhea?: Yes Constipation?: Yes  Constitutional Fever: No Night sweats?: No Weight loss?: No Fatigue?: Yes  Skin Skin rash/lesions?: No Itching?: No  Eyes Blurred vision?: No Double vision?: No  Ears/Nose/Throat Sore throat?: No Sinus problems?: No  Hematologic/Lymphatic Swollen glands?: No Easy bruising?: Yes  Cardiovascular Leg swelling?: Yes Chest pain?: No  Respiratory Cough?: No Shortness of breath?:  Yes  Endocrine Excessive thirst?: No  Musculoskeletal Back pain?: No Joint pain?: No  Neurological Headaches?: No Dizziness?: No  Psychologic Depression?: No Anxiety?: No  Physical Exam: BP 134/73 mmHg  Pulse 85  Ht 5\' 6"  (1.676 m)  Wt 138 lb 1.6 oz (62.642 kg)  BMI 22.30 kg/m2  Constitutional: Well nourished. Alert and oriented, No acute distress. HEENT: [place] AT, moist mucus membranes. Trachea midline, no masses. Cardiovascular: No clubbing, cyanosis, or edema. Respiratory: Normal respiratory effort, no increased work of breathing. Skin: No rashes, bruises or suspicious lesions. Lymph: No cervical or inguinal adenopathy. Neurologic: Grossly intact, no focal deficits, moving all 4 extremities. Psychiatric: Normal mood and affect.  Laboratory Data: Lab Results  Component Value Date   WBC 6.7 09/06/2013   HGB 11.1* 09/06/2013   HCT 33.7* 09/06/2013   MCV 92 09/06/2013   PLT 334 09/06/2013    Lab Results  Component Value Date   CREATININE 0.73 09/06/2013     Lab Results  Component Value Date   TSH 3.10 06/01/2012     Lab Results  Component Value Date   AST 30 01/11/2013     Pertinent imaging CLINICAL DATA: Recurrent urinary tract infections.  EXAM: RENAL / URINARY TRACT ULTRASOUND COMPLETE  COMPARISON: None.  FINDINGS: Right Kidney:  Length: 9.6 cm. Echogenicity within normal limits. No mass or hydronephrosis visualized.  Left Kidney:  Length: 9.1 cm. Limited visualization of the upper and lower poles of the left kidney due to overlying bowel gas. Echogenicity within normal limits. No mass or hydronephrosis visualized.  Bladder:  Appears normal for degree of bladder distention.  IMPRESSION: Normal ultrasound of the kidneys and bladder, with no hydronephrosis.   Electronically Signed  By: Ilona Sorrel M.D.  On: 10/13/2015 14:57 CLINICAL DATA: 82 year old female with a history of recurrent urinary tract  infection.  EXAM: ABDOMEN - 1 VIEW  COMPARISON: Plain film 02/21/2012, 11/25/2010  FINDINGS: Interval surgical changes of right hip fracture fixation with interval healing.  Diffuse osteopenia.  Surgical changes of prior vertebral augmentation.  Bony pelvic ring appears intact, with no fracture line identified.  Diffuse osteopenia.  Gas within stomach, small bowel, colon. No abnormally distended small bowel or colon. Gas extends to the rectum.  No radiopaque foreign body. No unexpected calcification.  IMPRESSION: Nonobstructive bowel gas pattern.  Signed,  Dulcy Fanny. Earleen Newport, DO  [facility]  [facility]   Electronically Signed  By: Corrie Mckusick D.O.  On: 10/13/2015 14:41   Assessment & Plan:    1. Recurrent UTI's:   KUB and RUS are normal.  She is asymptomatic at this time.  She will contact our office if she should develop symptoms of urinary tract infection for a catheterized specimen. If her symptoms should arise over the weekend or when we are not available, she needs to instruct the provider that she does see to catheterize her for a urine specimen prior to culture.   2. Atrophic vaginitis:  Patient will continue the vaginal estrogen cream 3 nights weekly.  She will return in 3 months for an exam.  3. Nocturia:   I have instructed the patient to limit fluids after 6:00 in the evening. I also instructed her not to drink any caffeinated or decaffeinated products after noon daily. She is also going to discuss with her primary care physician her risk factors for sleep apnea and see if a sleep study is warranted.      Return in about 3 months (around 01/27/2016) for symptom recheck and exam.  These notes generated with voice recognition software. I apologize for typographical errors.  Zara Council, [facility] [address] [place], [place]  [address] [phone]

## 2015-10-31 DIAGNOSIS — D649 Anemia, unspecified: Secondary | ICD-10-CM | POA: Insufficient documentation

## 2015-10-31 DIAGNOSIS — S72142A Displaced intertrochanteric fracture of left femur, initial encounter for closed fracture: Secondary | ICD-10-CM | POA: Insufficient documentation

## 2015-11-03 ENCOUNTER — Other Ambulatory Visit: Payer: Self-pay

## 2015-11-17 DIAGNOSIS — S62337A Displaced fracture of neck of fifth metacarpal bone, left hand, initial encounter for closed fracture: Secondary | ICD-10-CM | POA: Insufficient documentation

## 2015-12-03 ENCOUNTER — Encounter: Payer: Self-pay | Admitting: Urology

## 2015-12-03 ENCOUNTER — Ambulatory Visit (INDEPENDENT_AMBULATORY_CARE_PROVIDER_SITE_OTHER): Payer: Medicare Other | Admitting: Urology

## 2015-12-03 VITALS — BP 147/71 | HR 89 | Temp 98.2°F | Ht 65.0 in | Wt 131.3 lb

## 2015-12-03 DIAGNOSIS — N390 Urinary tract infection, site not specified: Secondary | ICD-10-CM

## 2015-12-03 DIAGNOSIS — N952 Postmenopausal atrophic vaginitis: Secondary | ICD-10-CM

## 2015-12-03 DIAGNOSIS — R339 Retention of urine, unspecified: Secondary | ICD-10-CM

## 2015-12-03 DIAGNOSIS — R351 Nocturia: Secondary | ICD-10-CM | POA: Diagnosis not present

## 2015-12-03 LAB — MICROSCOPIC EXAMINATION: WBC, UA: >30 /hpf — ABNORMAL HIGH (ref 0–?)

## 2015-12-03 LAB — URINALYSIS, COMPLETE
Bilirubin, UA: NEGATIVE
GLUCOSE, UA: NEGATIVE
KETONES UA: NEGATIVE
NITRITE UA: POSITIVE — AB
SPEC GRAV UA: 1.02 (ref 1.005–1.030)
Urobilinogen, Ur: 0.2 mg/dL (ref 0.2–1.0)
pH, UA: 7 (ref 5.0–7.5)

## 2015-12-03 NOTE — Progress Notes (Signed)
Catheter Removal  Patient is present today for a catheter removal.  8 ml of water was drained from the balloon. A 16 FR foley cath was removed from the bladder no complications were noted . Patient tolerated well.  Preformed by: [place]  Follow up/ Additional notes:

## 2015-12-03 NOTE — Progress Notes (Signed)
11:05 AM   Christine Mclean 1926/07/08 [id number]  Referring provider: Juluis Pitch, MD [phone] [place], [place] [address]  Chief Complaint  Patient presents with  . Advice Only    needs consult before d/c from facility [facility]    HPI: Patient is an 82 year old Caucasian female who presents today as a request from [facility] in [place] to evaluate for the possible removal of the indwelling foley.    Patient underwent a hip replacement and subsequently went into urinary retention. She failed a voiding trial on 2 occasions and has had this current Foley catheter in for the last 2-3 weeks.    She had seen [place] in the past for recurrent urinary tract infections, atrophic vaginitis and nocturia. Her PVRs have been minimal in our office.  Her daughter states that the facility was hesitant to cath her as needed as they were fearful this would cause more risk for urinary tract infections.  The facility does have the capability of CIC.  The facility physician would like a urinalysis and urine culture sent as she's been having low-grade fevers in the facility.    Renal ultrasound performed on 10/13/2015 was normal. KUB performed on 10/13/2015 did not demonstrate any nephrolithiasis. I reviewed the films with the patient.  Patient has not been using the vaginal estrogen cream since her hip fracture. She will reinstated at this time.  Patient has not discussed her risk factors for sleep apnea with her primary care physician as she suffered a fall and fractured her hip.  UA was suspicious for infection.  May be from colonization from an indwelling foley.    PMH: Past Medical History:  Diagnosis Date  . Arthritis   . Back pain   . Heart disease   . Heart failure ([place])   . HLD (hyperlipidemia)   . HTN (hypertension)   . Recurrent UTI   . Stroke (cerebrum) ([place])   . Uterine cancer [facility])     Surgical History: Past Surgical History:  Procedure  Laterality Date  . ABDOMINAL HYSTERECTOMY  1972  . APPENDECTOMY  1950  . BACK SURGERY  2016  . SKIN CANCER DESTRUCTION  2016    Home Medications:    Medication List       Accurate as of 12/03/15 11:05 AM. Always use your most recent med list.          acetaminophen 500 MG tablet Commonly known as:  TYLENOL Take by mouth.   alendronate 70 MG tablet Commonly known as:  FOSAMAX Take by mouth.   amLODipine 5 MG tablet Commonly known as:  NORVASC Take by mouth.   aspirin EC 81 MG tablet Take by mouth.   bisacodyl 5 MG EC tablet Commonly known as:  DULCOLAX Take by mouth.   Calcium Carbonate-Vitamin D 600-400 MG-UNIT tablet Take by mouth.   ciprofloxacin 500 MG tablet Commonly known as:  CIPRO Take 1 tablet (500 mg total) by mouth 2 (two) times daily.   D 2000 2000 units Tabs Generic drug:  Cholecalciferol Take by mouth.   estradiol 0.1 MG/GM vaginal cream Commonly known as:  ESTRACE VAGINAL Apply 0.5mg  (pea-sized amount)  just inside the vaginal introitus with a finger-tip every night for two weeks and then Monday, Wednesday and Friday nights.   ferrous sulfate 325 (65 FE) MG tablet Take by mouth.   furosemide 20 MG tablet Commonly known as:  LASIX Take 10 mg by mouth.   HYDROcodone-acetaminophen 5-325 MG tablet Commonly  known as:  NORCO/VICODIN Take by mouth. Reported on 10/07/2015   nitrofurantoin (macrocrystal-monohydrate) 100 MG capsule Commonly known as:  MACROBID Reported on 10/07/2015   NITROSTAT 0.4 MG SL tablet Generic drug:  nitroGLYCERIN PLACE 1 TABLET  UNDER THE TONGUE EVERY 5 (FIVE) MINUTES AS NEEDED FOR CHEST PAIN. MAY TAKE UP TO 3 DOSES.   RA VITAMIN B-12 TR 1000 MCG Tbcr Generic drug:  Cyanocobalamin Take by mouth.   simvastatin 20 MG tablet Commonly known as:  ZOCOR Take 20 mg by mouth. Reported on 05/12/2015   valsartan 40 MG tablet Commonly known as:  DIOVAN Take by mouth.       Allergies:  Allergies  Allergen Reactions    . Sulfa Antibiotics Nausea And Vomiting    Family History: Family History  Problem Relation Age of Onset  . Kidney disease Neg Hx   . Bladder Cancer Neg Hx     Social History:  reports that she has never smoked. She has never used smokeless tobacco. She reports that she does not drink alcohol or use drugs.  ROS: UROLOGY Frequent Urination?: No Hard to postpone urination?: No Burning/pain with urination?: No Get up at night to urinate?: No Leakage of urine?: No Urine stream starts and stops?: No Trouble starting stream?: No Do you have to strain to urinate?: No Blood in urine?: No Urinary tract infection?: No Sexually transmitted disease?: No Injury to kidneys or bladder?: No Painful intercourse?: No Weak stream?: No Currently pregnant?: No Vaginal bleeding?: No Last menstrual period?: n  Gastrointestinal Nausea?: No Vomiting?: No Indigestion/heartburn?: No Diarrhea?: Yes Constipation?: Yes  Constitutional Fever: Yes Night sweats?: No Weight loss?: No Fatigue?: Yes  Skin Skin rash/lesions?: No Itching?: No  Eyes Blurred vision?: No Double vision?: No  Ears/Nose/Throat Sore throat?: No Sinus problems?: No  Hematologic/Lymphatic Swollen glands?: No Easy bruising?: No  Cardiovascular Leg swelling?: Yes Chest pain?: No  Respiratory Cough?: No Shortness of breath?: No  Endocrine Excessive thirst?: No  Musculoskeletal Back pain?: No Joint pain?: Yes  Neurological Headaches?: No Dizziness?: No  Psychologic Depression?: No Anxiety?: Yes  Physical Exam: BP (!) 147/71   Pulse 89   Temp 98.2 F (36.8 C) (Oral)   Ht 5\' 5"  (1.651 m)   Wt 131 lb 4.8 oz (59.6 kg)   BMI 21.85 kg/m   Constitutional: Well nourished. Alert and oriented, No acute distress. HEENT: [place] AT, moist mucus membranes. Trachea midline, no masses. Cardiovascular: No clubbing, cyanosis, or edema. Respiratory: Normal respiratory effort, no increased work of  breathing. Skin: No rashes, bruises or suspicious lesions. Lymph: No cervical or inguinal adenopathy. Neurologic: Grossly intact, no focal deficits, moving all 4 extremities. Psychiatric: Normal mood and affect.  Laboratory Data: Lab Results  Component Value Date   WBC 6.7 09/06/2013   HGB 11.1 (L) 09/06/2013   HCT 33.7 (L) 09/06/2013   MCV 92 09/06/2013   PLT 334 09/06/2013    Lab Results  Component Value Date   CREATININE 0.73 09/06/2013     Lab Results  Component Value Date   TSH 3.10 06/01/2012     Lab Results  Component Value Date   AST 30 01/11/2013     Pertinent imaging CLINICAL DATA: Recurrent urinary tract infections.  EXAM: RENAL / URINARY TRACT ULTRASOUND COMPLETE  COMPARISON: None.  FINDINGS: Right Kidney:  Length: 9.6 cm. Echogenicity within normal limits. No mass or hydronephrosis visualized.  Left Kidney:  Length: 9.1 cm. Limited visualization of the upper and lower poles of the left  kidney due to overlying bowel gas. Echogenicity within normal limits. No mass or hydronephrosis visualized.  Bladder:  Appears normal for degree of bladder distention.  IMPRESSION: Normal ultrasound of the kidneys and bladder, with no hydronephrosis.   Electronically Signed  By: Ilona Sorrel M.D.  On: 10/13/2015 14:57 CLINICAL DATA: 82 year old female with a history of recurrent urinary tract infection.  EXAM: ABDOMEN - 1 VIEW  COMPARISON: Plain film 02/21/2012, 11/25/2010  FINDINGS: Interval surgical changes of right hip fracture fixation with interval healing.  Diffuse osteopenia.  Surgical changes of prior vertebral augmentation.  Bony pelvic ring appears intact, with no fracture line identified.  Diffuse osteopenia.  Gas within stomach, small bowel, colon. No abnormally distended small bowel or colon. Gas extends to the rectum.  No radiopaque foreign body. No unexpected  calcification.  IMPRESSION: Nonobstructive bowel gas pattern.  Signed,  Dulcy Fanny. Earleen Newport, DO  [facility]  [facility]   Electronically Signed  By: Corrie Mckusick D.O.  On: 10/13/2015 14:41   Assessment & Plan:    1. Recurrent UTI's:   Physician at the facility is concerned she may have an UTI due to her reoccurring low grade fevers, per daughter  - foley clamped for UA and urine culture 2. Atrophic vaginitis:  Patient will continue the vaginal estrogen cream 3 nights weekly.  She will return in 3 months for an exam.  3. Nocturia:   I have instructed the patient to limit fluids after 6:00 in the evening. I also instructed her not to drink any caffeinated or decaffeinated products after noon daily. She is also going to discuss with her primary care physician her risk factors for sleep apnea and see if a sleep study is warranted once she has recovered from her hip surgery.  4. Urinary retention  - foley removed today  - explained to the daughter that it is a lower risk of infection to straight cath than have an indwelling foley  - facility with CIC x 3, daily and record volumes  - follow up in one month for PVR    Return in about 1 month (around 01/03/2016) for PVR and symptom recheck.  These notes generated with voice recognition software. I apologize for typographical errors.  Zara Council, [facility] [address] [place], [place] [address] [phone]

## 2015-12-05 LAB — CULTURE, URINE COMPREHENSIVE

## 2015-12-06 ENCOUNTER — Other Ambulatory Visit: Payer: Self-pay | Admitting: Urology

## 2015-12-06 MED ORDER — NITROFURANTOIN MONOHYD MACRO 100 MG PO CAPS: 100.0000 mg | ORAL_CAPSULE | Freq: Two times a day (BID) | ORAL | 0 refills | Status: DC

## 2015-12-06 NOTE — Progress Notes (Signed)
Please notify the patient that they have a positive urine culture and I have sent an antibiotic to [facility].

## 2015-12-08 NOTE — Progress Notes (Signed)
Spoke with Vaughan Basta at [facility] and gave verbal orders of macrobid. Vaughan Basta voiced understanding.

## 2015-12-22 ENCOUNTER — Ambulatory Visit (INDEPENDENT_AMBULATORY_CARE_PROVIDER_SITE_OTHER): Payer: Medicare Other | Admitting: Family Medicine

## 2015-12-22 VITALS — BP 138/69 | HR 112 | Ht 65.0 in | Wt 132.2 lb

## 2015-12-22 DIAGNOSIS — N390 Urinary tract infection, site not specified: Secondary | ICD-10-CM

## 2015-12-22 NOTE — Progress Notes (Signed)
In and Out Catheterization  Patient is present today for a I & O catheterization due to urinary frequency and dysuria. Patient was cleaned and prepped in a sterile fashion with betadine and Lidocaine 2% jelly was instilled into the urethra.  A 14FR cath was inserted no complications were noted , 41ml of urine return was noted, urine was yellow in color. A clean urine sample was collected for UA and culture. Bladder was drained  And catheter was removed with out difficulty.    Preformed by: Elberta Leatherwood, CMA  Follow up/ Additional notes: Patient presents today with urinary frequency, dysuria and nausea. She has been on ABX in the last 30 days, has not had any Urological surgeries in the last 30 days.

## 2015-12-23 LAB — MICROSCOPIC EXAMINATION: RBC MICROSCOPIC, UA: NONE SEEN /HPF (ref 0–?)

## 2015-12-23 LAB — URINALYSIS, COMPLETE
Bilirubin, UA: NEGATIVE
Glucose, UA: NEGATIVE
KETONES UA: NEGATIVE
NITRITE UA: POSITIVE — AB
Protein, UA: NEGATIVE
RBC UA: NEGATIVE
Specific Gravity, UA: 1.01 (ref 1.005–1.030)
Urobilinogen, Ur: 0.2 mg/dL (ref 0.2–1.0)
pH, UA: 5.5 (ref 5.0–7.5)

## 2015-12-25 LAB — CULTURE, URINE COMPREHENSIVE

## 2015-12-31 ENCOUNTER — Other Ambulatory Visit: Payer: Self-pay | Admitting: Family Medicine

## 2015-12-31 MED ORDER — AMOXICILLIN-POT CLAVULANATE 875-125 MG PO TABS: 1.0000 | ORAL_TABLET | Freq: Two times a day (BID) | ORAL | 0 refills | Status: DC

## 2015-12-31 NOTE — Telephone Encounter (Signed)
Per Larene Beach patient is to start Augmentin for UTI. Patient notified.

## 2016-01-02 ENCOUNTER — Ambulatory Visit: Payer: Medicare Other | Admitting: Urology

## 2016-01-07 ENCOUNTER — Telehealth: Payer: Self-pay | Admitting: Urology

## 2016-01-07 NOTE — Telephone Encounter (Signed)
After reviewing her PVR's, I have a high suspicion that she has sleep apnea.  Her first caths of the day have high volumes.  I would suggest a sleep study.

## 2016-01-07 NOTE — Telephone Encounter (Signed)
Patient's daughter notified and she will talk to [place], they have an appointment tomorrow with her PCP and they will speak to them about a referral.

## 2016-01-08 DIAGNOSIS — Z9181 History of falling: Secondary | ICD-10-CM | POA: Insufficient documentation

## 2016-01-09 ENCOUNTER — Telehealth: Payer: Self-pay | Admitting: *Deleted

## 2016-01-09 NOTE — Telephone Encounter (Signed)
Patient daughter called stating the patient just finished her antibiotic and she wants her to have another cath specimen because she is urinating frequently. I spoke with Larene Beach and she states patient has follow up appointment in two weeks and we would not check another specimen this soon and for patient to call office back if she starts with dysuria, nausea/vomitting, confusion, or fever. Patient's daughter understands and ok with plan.

## 2016-01-27 ENCOUNTER — Ambulatory Visit (INDEPENDENT_AMBULATORY_CARE_PROVIDER_SITE_OTHER): Payer: Medicare Other | Admitting: Urology

## 2016-01-27 ENCOUNTER — Encounter: Payer: Self-pay | Admitting: Urology

## 2016-01-27 VITALS — BP 152/84 | HR 80 | Ht 66.0 in | Wt 128.7 lb

## 2016-01-27 DIAGNOSIS — R351 Nocturia: Secondary | ICD-10-CM | POA: Diagnosis not present

## 2016-01-27 DIAGNOSIS — N390 Urinary tract infection, site not specified: Secondary | ICD-10-CM

## 2016-01-27 DIAGNOSIS — N952 Postmenopausal atrophic vaginitis: Secondary | ICD-10-CM | POA: Diagnosis not present

## 2016-01-27 DIAGNOSIS — R339 Retention of urine, unspecified: Secondary | ICD-10-CM

## 2016-01-27 NOTE — Progress Notes (Signed)
2:32 PM   Michalla Celestine June 18, 1926 [id number]  Referring provider: Juluis Pitch, MD [id number], [place] [address]  Chief Complaint  Patient presents with  . Vaginal Atrophy    3 month follow up  . Recurrent UTI    HPI: Patient is an 82 year old Caucasian female who presents today as a 1 month follow-up to check a PVR, vaginal exam and a symptom recheck for recurrent UTIs, atrophic vaginitis, nocturia and a history of urinary retention.   Recurrent urinary tract infections  Patient was referred by her PCP, Dr. Lovie Macadamia, for recurrent UTI's.  Over the last year, patient has had 2 UTI's with pseudomonas and enterococcus.  The year prior she had 3 UTI's with pseudomonas and klebsiella.  She did undergo a CT with contrast in 01/2015 and no stones were visualized, kidneys were normal bilaterally and the bladder was unremarkable.  An abdominal film was taken in 01/2015 and no stones were seen on plain film.  She has had to have IV antibiotics and has been on fosfomycin, but she has to pay out of pocket for the fosfomycin.  She states she can tell when she has infections.  She just feels "really bad."  Her daughter, [name], has noticed mental status changes with the UTI's.  She has not had fevers, chills, nausea or vomiting with the infections. She does suffer with constipation and diarrhea.  She is currently asymptomatic at this time.  Renal ultrasound performed on 10/13/2015 was normal. KUB performed on 10/13/2015 did not demonstrate any nephrolithiasis.  Patient had a urinary tract infection in August 2017 positive for Klebsiella.  She had not been receiving her vaginal cream in the hospital her rehabilitation center.  Atrophic vaginitis Patient states that she has been home for 1 month. She has been applying the vaginal cream 3 nights weekly.  Nocturia Patient still experiencing nocturia 3-5 times nightly.  She tried discontinuing fluid intake after 6 PM, but she  found no relief in her nocturia.  History of urinary retention Patient underwent a hip replacement and subsequently went into urinary retention. She failed a voiding trial on 2 occasions.   The facility does have the capability of CIC.  He has been home for 1 month and states she is voiding well and feels she is emptying her bladder.   PMH: Past Medical History:  Diagnosis Date  . Arthritis   . Back pain   . Heart disease   . Heart failure ([place])   . HLD (hyperlipidemia)   . HTN (hypertension)   . Recurrent UTI   . Stroke (cerebrum) ([place])   . Uterine cancer [facility])     Surgical History: Past Surgical History:  Procedure Laterality Date  . ABDOMINAL HYSTERECTOMY  1972  . APPENDECTOMY  1950  . BACK SURGERY  2016  . SKIN CANCER DESTRUCTION  2016  . TOTAL HIP ARTHROPLASTY Bilateral    done seperately    Home Medications:    Medication List       Accurate as of 01/27/16  2:32 PM. Always use your most recent med list.          acetaminophen 500 MG tablet Commonly known as:  TYLENOL Take by mouth.   alendronate 70 MG tablet Commonly known as:  FOSAMAX Take by mouth.   amLODipine 5 MG tablet Commonly known as:  NORVASC Take by mouth.   amoxicillin-clavulanate 875-125 MG tablet Commonly known as:  AUGMENTIN Take 1 tablet by mouth every  12 (twelve) hours.   aspirin EC 81 MG tablet Take by mouth.   Calcium Carbonate-Vitamin D 600-400 MG-UNIT tablet Take by mouth.   ciprofloxacin 500 MG tablet Commonly known as:  CIPRO Take 1 tablet (500 mg total) by mouth 2 (two) times daily.   D 2000 2000 units Tabs Generic drug:  Cholecalciferol Take by mouth.   estradiol 0.1 MG/GM vaginal cream Commonly known as:  ESTRACE VAGINAL Apply 0.5mg  (pea-sized amount)  just inside the vaginal introitus with a finger-tip every night for two weeks and then Monday, Wednesday and Friday nights.   ferrous sulfate 325 (65 FE) MG tablet Take by mouth.   furosemide 20 MG  tablet Commonly known as:  LASIX Take 10 mg by mouth.   HYDROcodone-acetaminophen 5-325 MG tablet Commonly known as:  NORCO/VICODIN Take by mouth. Reported on 10/07/2015   nitrofurantoin (macrocrystal-monohydrate) 100 MG capsule Commonly known as:  MACROBID Take 1 capsule (100 mg total) by mouth every 12 (twelve) hours.   NITROSTAT 0.4 MG SL tablet Generic drug:  nitroGLYCERIN PLACE 1 TABLET  UNDER THE TONGUE EVERY 5 (FIVE) MINUTES AS NEEDED FOR CHEST PAIN. MAY TAKE UP TO 3 DOSES.   RA VITAMIN B-12 TR 1000 MCG Tbcr Generic drug:  Cyanocobalamin Take by mouth.   simvastatin 20 MG tablet Commonly known as:  ZOCOR Take 20 mg by mouth. Reported on 05/12/2015   valsartan 40 MG tablet Commonly known as:  DIOVAN Take by mouth.       Allergies:  Allergies  Allergen Reactions  . Sulfa Antibiotics Nausea And Vomiting    Family History: Family History  Problem Relation Age of Onset  . Kidney disease Neg Hx   . Bladder Cancer Neg Hx     Social History:  reports that she has never smoked. She has never used smokeless tobacco. She reports that she does not drink alcohol or use drugs.  ROS: UROLOGY Frequent Urination?: No Hard to postpone urination?: Yes Burning/pain with urination?: No Get up at night to urinate?: Yes Leakage of urine?: Yes Urine stream starts and stops?: No Trouble starting stream?: No Do you have to strain to urinate?: No Blood in urine?: No Urinary tract infection?: No Sexually transmitted disease?: No Injury to kidneys or bladder?: No Painful intercourse?: No Weak stream?: No Currently pregnant?: No Vaginal bleeding?: No Last menstrual period?: n  Gastrointestinal Nausea?: No Vomiting?: No Indigestion/heartburn?: No Diarrhea?: No Constipation?: No  Constitutional Fever: No Night sweats?: No Weight loss?: No Fatigue?: No  Skin Skin rash/lesions?: No Itching?: No  Eyes Blurred vision?: No Double vision?:  No  Ears/Nose/Throat Sore throat?: No Sinus problems?: No  Hematologic/Lymphatic Swollen glands?: No Easy bruising?: No  Cardiovascular Leg swelling?: Yes Chest pain?: No  Respiratory Cough?: No Shortness of breath?: No  Endocrine Excessive thirst?: No  Musculoskeletal Back pain?: Yes Joint pain?: No  Neurological Headaches?: No Dizziness?: No  Psychologic Depression?: No Anxiety?: No  Physical Exam: BP (!) 152/84   Pulse 80   Ht 5\' 6"  (1.676 m)   Wt 128 lb 11.2 oz (58.4 kg)   BMI 20.77 kg/m   Constitutional: Well nourished. Alert and oriented, No acute distress. HEENT: [place] AT, moist mucus membranes. Trachea midline, no masses. Cardiovascular: No clubbing, cyanosis, or edema. Respiratory: Normal respiratory effort, no increased work of breathing. GI: Abdomen is soft, non tender, non distended, no abdominal masses. Liver and spleen not palpable.  No hernias appreciated.  Stool sample for occult testing is not indicated.   GU:  No CVA tenderness.  No bladder fullness or masses.  Atrophic external genitalia, normal pubic hair distribution, no lesions.  Normal urethral meatus, no lesions, no prolapse, no discharge.   No urethral  tenderness.  Urethral caruncle is noted.  No bladder fullness, tenderness or masses. Atrophic vagina mucosa, narrowing of the vaginal vault, poor estrogen effect, no discharge, no lesions, good pelvic support, no cystocele or rectocele noted.  Uterus and cervix are surgically absent.  No pelvic masses noted. Anus and perineum are without rashes or lesions.    Skin: No rashes, bruises or suspicious lesions. Lymph: No cervical or inguinal adenopathy. Neurologic: Grossly intact, no focal deficits, moving all 4 extremities. Psychiatric: Normal mood and affect.  Laboratory Data: Lab Results  Component Value Date   WBC 6.7 09/06/2013   HGB 11.1 (L) 09/06/2013   HCT 33.7 (L) 09/06/2013   MCV 92 09/06/2013   PLT 334 09/06/2013    Lab Results   Component Value Date   CREATININE 0.73 09/06/2013     Lab Results  Component Value Date   TSH 3.10 06/01/2012     Lab Results  Component Value Date   AST 30 01/11/2013     Assessment & Plan:    1. Recurrent UTI's  - not having the symptoms of UTI at this time  - reviewed symptoms of an UTI - fevers, gross hematuria, burning with urination  - reviewed preventative measures- cranberry tablets, Vitamin C, probiotics, drinking a lot of water  2. Atrophic vaginitis:  Patient will continue the vaginal estrogen cream 3 nights weekly- prescription given  3. Nocturia  - I explained to the patient that nocturia is often multi-factorial and difficult to treat.  Sleeping disorders, heart conditions and peripheral vascular disease, diabetes,  enlarged prostate or urethral stricture causing bladder outlet obstruction and/or certain medications.  - Patient did not find benefit with fluid restrictions  - I explained to the patient that research studies have showed that over 84% of patients with sleep apnea reported frequent nighttime urination.   With sleep apnea, oxygen decreases, carbon dioxide increases, the blood become more acidic, the heart rate drops and blood vessels in the lung constrict.  The body is then alerted that something is very wrong. The sleeper must wake enough to reopen the airway. By this time, the heart is racing and experiences a false signal of fluid overload. The heart excretes a hormone-like protein that tells the body to get rid of sodium and water, resulting in nocturia.             - The patient may also benefit from a discussion with his primary care physician to see if she has risk factors for sleep apnea or other sleep disturbances and obtaining a sleep study  4. Incontinence  - Patient states the majority of her incontinence occurs at night, they will speak to their primary care physician regarding a sleep study  - Patient is not a candidate for anticholinergics  due to her age and history of urinary retention  - Patient is not a candidate for beta generic receptor agonist due to blood pressure issues  - I discussed non-pharmacological therapy such as PTNS, PNE and Botox injections into the bladder  - She is interested in starting PTNS therapy   .5. Urinary retention  - Patient voiding well on her own  Return for schedule PTNS.  These notes generated with voice recognition software. I apologize for typographical errors.  Zara Council, [facility] [address], [place], [place] [address] [phone]

## 2016-01-28 ENCOUNTER — Telehealth: Payer: Self-pay | Admitting: *Deleted

## 2016-01-28 NOTE — Telephone Encounter (Signed)
Spoke with patient daughter and set up appointments for PTNS. Will start on Friday October 13th at 11:00 am in the [place] office. Patient daughter states ok with the plan.

## 2016-02-06 ENCOUNTER — Ambulatory Visit: Payer: Medicare Other | Admitting: Urology

## 2016-02-13 ENCOUNTER — Ambulatory Visit: Payer: Medicare Other | Admitting: Urology

## 2016-02-13 ENCOUNTER — Telehealth: Payer: Self-pay | Admitting: *Deleted

## 2016-02-13 NOTE — Telephone Encounter (Signed)
Tried to call patient and ask if she still wanted to do the PTNS procedure since she had no showed for the last 2 weeks. Patient's daughter answered the phone and said they were on their way, I let her know that we would have to start next week because she was already 10 minutes late and still not here and the provider had to keep her schedule because of going to the airport. Patient's daughter then tells me that two of her other doctors think her symptoms may be neurological and that they are going to investigate that and will call us back when she wants to have her PTNS procedure appointments rebooked. I let her know that I would let Larene Beach know.

## 2016-02-20 ENCOUNTER — Ambulatory Visit: Payer: Medicare Other | Admitting: Urology

## 2016-02-27 ENCOUNTER — Ambulatory Visit: Payer: Medicare Other | Admitting: Urology

## 2016-03-05 ENCOUNTER — Ambulatory Visit: Payer: Medicare Other | Admitting: Urology

## 2016-03-12 ENCOUNTER — Ambulatory Visit: Payer: Medicare Other | Admitting: Urology

## 2016-03-17 ENCOUNTER — Ambulatory Visit: Payer: Medicare Other | Admitting: Urology

## 2016-03-26 ENCOUNTER — Ambulatory Visit: Payer: Medicare Other | Admitting: Urology

## 2016-04-02 ENCOUNTER — Ambulatory Visit: Payer: Medicare Other | Admitting: Urology

## 2016-04-09 ENCOUNTER — Ambulatory Visit: Payer: Medicare Other | Admitting: Urology

## 2016-04-16 ENCOUNTER — Ambulatory Visit: Payer: Medicare Other | Admitting: Urology

## 2016-04-27 ENCOUNTER — Ambulatory Visit: Payer: Medicare Other | Admitting: Urology

## 2016-06-09 ENCOUNTER — Ambulatory Visit (INDEPENDENT_AMBULATORY_CARE_PROVIDER_SITE_OTHER): Payer: Medicare Other

## 2016-06-09 VITALS — BP 167/77 | HR 88 | Ht 66.0 in | Wt 140.6 lb

## 2016-06-09 DIAGNOSIS — N390 Urinary tract infection, site not specified: Secondary | ICD-10-CM | POA: Diagnosis not present

## 2016-06-09 LAB — URINALYSIS, COMPLETE
Bilirubin, UA: NEGATIVE
Glucose, UA: NEGATIVE
Ketones, UA: NEGATIVE
NITRITE UA: NEGATIVE
PH UA: 6 (ref 5.0–7.5)
Protein, UA: NEGATIVE
RBC, UA: NEGATIVE
Specific Gravity, UA: <1.005 — ABNORMAL LOW (ref 1.005–1.030)
Urobilinogen, Ur: 0.2 mg/dL (ref 0.2–1.0)

## 2016-06-09 LAB — MICROSCOPIC EXAMINATION
RBC MICROSCOPIC, UA: NONE SEEN /HPF (ref 0–?)
WBC, UA: >30 /hpf — AB (ref 0–?)

## 2016-06-09 NOTE — Progress Notes (Signed)
Pt presented today with c/o urinary frequency and urgency, hard to postpone urination, dysuria and leakage of urine. A CATH specimen was obtained for u/a and cx.   Blood pressure (!) 167/77, pulse 88, height 5\' 6"  (1.676 m), weight 140 lb 9.6 oz (63.8 kg).

## 2016-06-10 ENCOUNTER — Other Ambulatory Visit: Payer: Self-pay

## 2016-06-12 LAB — CULTURE, URINE COMPREHENSIVE

## 2016-06-14 ENCOUNTER — Telehealth: Payer: Self-pay

## 2016-06-14 DIAGNOSIS — N390 Urinary tract infection, site not specified: Secondary | ICD-10-CM

## 2016-06-14 MED ORDER — AMOXICILLIN-POT CLAVULANATE 875-125 MG PO TABS: 1.0000 | ORAL_TABLET | Freq: Two times a day (BID) | ORAL | 0 refills | Status: DC

## 2016-06-14 NOTE — Telephone Encounter (Signed)
Spoke with pt daughter in reference to +ucx. Made daughter aware will need to take probiotic, cranberry tabs, vit C, and estrogen cream. Daughter voiced understanding.

## 2016-06-14 NOTE — Telephone Encounter (Signed)
-----   Message from Nori Riis, PA-C sent at 06/13/2016 10:36 AM EST ----- Patient has a +UCx.  They need to start Augmentin 875/125,  one tablet twice daily for seven days.  They also need to take a probiotic, increase water intake, take cranberry tablets and/or drink the juice, take 1000 mg of Vitamin C and use the vaginal estrogen cream with the antibiotic course and continue after they complete antibiotics.

## 2016-07-08 ENCOUNTER — Ambulatory Visit: Payer: Medicare Other | Admitting: Urology

## 2016-07-08 ENCOUNTER — Encounter: Payer: Self-pay | Admitting: Urology

## 2016-07-08 ENCOUNTER — Telehealth: Payer: Self-pay

## 2016-07-08 VITALS — BP 161/73 | HR 75 | Ht 66.0 in | Wt 141.0 lb

## 2016-07-08 DIAGNOSIS — R339 Retention of urine, unspecified: Secondary | ICD-10-CM

## 2016-07-08 DIAGNOSIS — N390 Urinary tract infection, site not specified: Secondary | ICD-10-CM

## 2016-07-08 DIAGNOSIS — R351 Nocturia: Secondary | ICD-10-CM

## 2016-07-08 DIAGNOSIS — R32 Unspecified urinary incontinence: Secondary | ICD-10-CM | POA: Diagnosis not present

## 2016-07-08 DIAGNOSIS — N952 Postmenopausal atrophic vaginitis: Secondary | ICD-10-CM | POA: Diagnosis not present

## 2016-07-08 DIAGNOSIS — R3911 Hesitancy of micturition: Secondary | ICD-10-CM | POA: Diagnosis not present

## 2016-07-08 LAB — URINALYSIS, COMPLETE
BILIRUBIN UA: NEGATIVE
GLUCOSE, UA: NEGATIVE
KETONES UA: NEGATIVE
Leukocytes, UA: NEGATIVE
NITRITE UA: NEGATIVE
Protein, UA: NEGATIVE
RBC UA: NEGATIVE
SPEC GRAV UA: 1.01 (ref 1.005–1.030)
Urobilinogen, Ur: 0.2 mg/dL (ref 0.2–1.0)
pH, UA: 5.5 (ref 5.0–7.5)

## 2016-07-08 LAB — BLADDER SCAN AMB NON-IMAGING: Scan Result: 160

## 2016-07-08 LAB — MICROSCOPIC EXAMINATION
RBC MICROSCOPIC, UA: NONE SEEN /HPF (ref 0–?)
WBC UA: NONE SEEN /HPF (ref 0–?)

## 2016-07-08 NOTE — Telephone Encounter (Signed)
Pt daughter called stating pt went from urinating everywhere to not being able to urinate and now her feet are swelling. Per Larene Beach pt was added to her schedule today to be seen.

## 2016-07-08 NOTE — Progress Notes (Signed)
In and Out Catheterization  Patient is present today for a I & O catheterization due to patient stating she can't urinate. Patient was cleaned and prepped in a sterile fashion with betadine and Lidocaine 2% jelly was instilled into the urethra.  A 14FR cath was inserted no complications were noted , 133ml of urine return was noted, urine was yellow in color. A clean urine sample was collected for Urinalysis and culture. Bladder was drained and catheter was removed with out difficulty.    Preformed by: Lyndee Hensen CMA

## 2016-07-08 NOTE — Progress Notes (Signed)
11:33 AM   Demaris Callander 1926/05/17 [id number]  Referring provider: Stoney Bang, MD [address] [place], [place] [address]  Chief Complaint  Patient presents with  . Urinary Retention    patient called this am can't urinate    HPI: Patient is an 83 year old Caucasian female who presents today requesting an urgent appointment for not urinating.    Patient states that this morning, she did not experience urge incontinence when she first put her feet on the floor this morning.  She is not experiencing dysuria, gross hematuria or suprapubic pain.  She is not experiencing fevers, chills, nausea or vomiting.  Her only urinary complaint is hesitancy.  She does admit that she is not drinking enough water.    Recurrent urinary tract infections  Over the last year, patient has had 2 UTI's with pseudomonas and enterococcus.  The year prior she had 3 UTI's with pseudomonas and klebsiella.  She did undergo a CT with contrast in 01/2015 and no stones were visualized, kidneys were normal bilaterally and the bladder was unremarkable.  An abdominal film was taken in 01/2015 and no stones were seen on plain film.  Her risk factors for UTI's are age, vaginal atrophy, constipation and diarrhea.  Her UA today was unremarkable.    Atrophic vaginitis She has been applying the vaginal cream 3 nights weekly.  Nocturia Patient still experiencing nocturia 3-5 times nightly.  She tried discontinuing fluid intake after 6 PM, but she found no relief in her nocturia.  History of urinary retention Patient underwent a hip replacement and subsequently went into urinary retention. She failed a voiding trial on 2 occasions.   The facility does have the capability of CIC.  At this time, she is emptying her bladder.  Her PVR today is 160 mL.     PMH: Past Medical History:  Diagnosis Date  . Arthritis   . Back pain   . Heart disease   . Heart failure ([place])   . HLD  (hyperlipidemia)   . HTN (hypertension)   . Recurrent UTI   . Stroke (cerebrum) ([place])   . Uterine cancer [facility])     Surgical History: Past Surgical History:  Procedure Laterality Date  . ABDOMINAL HYSTERECTOMY  1972  . APPENDECTOMY  1950  . BACK SURGERY  2016  . SKIN CANCER DESTRUCTION  2016  . TOTAL HIP ARTHROPLASTY Bilateral    done seperately    Home Medications:  Allergies as of 07/08/2016      Reactions   Sulfa Antibiotics Nausea And Vomiting      Medication List       Accurate as of 07/08/16 11:33 AM. Always use your most recent med list.          acetaminophen 500 MG tablet Commonly known as:  TYLENOL Take by mouth.   alendronate 70 MG tablet Commonly known as:  FOSAMAX Take by mouth.   amLODipine 5 MG tablet Commonly known as:  NORVASC Take by mouth.   amoxicillin-clavulanate 875-125 MG tablet Commonly known as:  AUGMENTIN Take 1 tablet by mouth every 12 (twelve) hours.   aspirin EC 81 MG tablet Take by mouth.   Calcium Carbonate-Vitamin D 600-400 MG-UNIT tablet Take by mouth.   ciprofloxacin 500 MG tablet Commonly known as:  CIPRO Take 1 tablet (500 mg total) by mouth 2 (two) times daily.   D 2000 2000 units Tabs Generic drug:  Cholecalciferol Take by mouth.   estradiol  0.1 MG/GM vaginal cream Commonly known as:  ESTRACE VAGINAL Apply 0.5mg  (pea-sized amount)  just inside the vaginal introitus with a finger-tip every night for two weeks and then Monday, Wednesday and Friday nights.   ferrous sulfate 325 (65 FE) MG tablet Take by mouth.   furosemide 20 MG tablet Commonly known as:  LASIX Take 10 mg by mouth.   HYDROcodone-acetaminophen 5-325 MG tablet Commonly known as:  NORCO/VICODIN Take by mouth. Reported on 10/07/2015   nitrofurantoin (macrocrystal-monohydrate) 100 MG capsule Commonly known as:  MACROBID Take 1 capsule (100 mg total) by mouth every 12 (twelve) hours.   NITROSTAT 0.4 MG SL tablet Generic drug:   nitroGLYCERIN PLACE 1 TABLET  UNDER THE TONGUE EVERY 5 (FIVE) MINUTES AS NEEDED FOR CHEST PAIN. MAY TAKE UP TO 3 DOSES.   polyethylene glycol packet Commonly known as:  MIRALAX / GLYCOLAX Take by mouth.   RA VITAMIN B-12 TR 1000 MCG Tbcr Generic drug:  Cyanocobalamin Take by mouth.   simvastatin 20 MG tablet Commonly known as:  ZOCOR Take 20 mg by mouth. Reported on 05/12/2015   valsartan 40 MG tablet Commonly known as:  DIOVAN Take by mouth.       Allergies:  Allergies  Allergen Reactions  . Sulfa Antibiotics Nausea And Vomiting    Family History: Family History  Problem Relation Age of Onset  . Kidney disease Neg Hx   . Bladder Cancer Neg Hx   . Kidney cancer Neg Hx   . Prostate cancer Neg Hx     Social History:  reports that she has never smoked. She has never used smokeless tobacco. She reports that she does not drink alcohol or use drugs.  ROS: UROLOGY Frequent Urination?: No Hard to postpone urination?: No Burning/pain with urination?: No Get up at night to urinate?: No Leakage of urine?: No Urine stream starts and stops?: No Trouble starting stream?: Yes Do you have to strain to urinate?: No Blood in urine?: No Urinary tract infection?: No Sexually transmitted disease?: No Injury to kidneys or bladder?: No Painful intercourse?: No Weak stream?: No Currently pregnant?: No Vaginal bleeding?: No Last menstrual period?: n  Gastrointestinal Nausea?: No Vomiting?: No Indigestion/heartburn?: No Diarrhea?: No Constipation?: No  Constitutional Fever: No Night sweats?: No Weight loss?: No Fatigue?: No  Skin Skin rash/lesions?: No Itching?: No  Eyes Blurred vision?: No Double vision?: No  Ears/Nose/Throat Sore throat?: No Sinus problems?: No  Hematologic/Lymphatic Swollen glands?: No Easy bruising?: Yes  Cardiovascular Leg swelling?: No Chest pain?: No  Respiratory Cough?: No Shortness of breath?: Yes  Endocrine Excessive  thirst?: No  Musculoskeletal Back pain?: No Joint pain?: No  Neurological Headaches?: Yes Dizziness?: Yes  Psychologic Depression?: No Anxiety?: No  Physical Exam: BP (!) 161/73   Pulse 75   Ht 5\' 6"  (1.676 m)   Wt 141 lb (64 kg)   BMI 22.76 kg/m   Constitutional: Well nourished. Alert and oriented, No acute distress. HEENT: [place] AT, moist mucus membranes. Trachea midline, no masses. Cardiovascular: No clubbing, cyanosis, or edema. Respiratory: Normal respiratory effort, no increased work of breathing. GI: Abdomen is soft, non tender, non distended, no abdominal masses. Liver and spleen not palpable.  No hernias appreciated.  Stool sample for occult testing is not indicated.   GU: No CVA tenderness.  No bladder fullness or masses.  Atrophic external genitalia, normal pubic hair distribution, no lesions.  Normal urethral meatus, no lesions, no prolapse, no discharge.   No urethral  tenderness.  Urethral  caruncle is noted.  No bladder fullness, tenderness or masses. Atrophic vagina mucosa, narrowing of the vaginal vault, poor estrogen effect, no discharge, no lesions, good pelvic support, no cystocele or rectocele noted.  Uterus and cervix are surgically absent.  No pelvic masses noted. Anus and perineum are without rashes or lesions.    Skin: No rashes, bruises or suspicious lesions. Lymph: No cervical or inguinal adenopathy. Neurologic: Grossly intact, no focal deficits, moving all 4 extremities. Psychiatric: Normal mood and affect.  Laboratory Data: Lab Results  Component Value Date   WBC 6.7 09/06/2013   HGB 11.1 (L) 09/06/2013   HCT 33.7 (L) 09/06/2013   MCV 92 09/06/2013   PLT 334 09/06/2013    Lab Results  Component Value Date   CREATININE 0.73 09/06/2013     Lab Results  Component Value Date   TSH 3.10 06/01/2012     Lab Results  Component Value Date   AST 30 01/11/2013     Assessment & Plan:    1. Recurrent UTI's  - not having the symptoms of UTI  at this time  - reviewed symptoms of an UTI - fevers, gross hematuria, burning with urination  - reviewed preventative measures- cranberry tablets, Vitamin C, probiotics, drinking a lot of water  2. Atrophic vaginitis:  Patient will continue the vaginal estrogen cream 3 nights weekly- prescription given  3. Nocturia  - I explained to the patient that nocturia is often multi-factorial and difficult to treat.  Sleeping disorders, heart conditions and peripheral vascular disease, diabetes,  enlarged prostate or urethral stricture causing bladder outlet obstruction and/or certain medications.  - Patient did not find benefit with fluid restrictions  - I explained to the patient that research studies have showed that over 84% of patients with sleep apnea reported frequent nighttime urination.   With sleep apnea, oxygen decreases, carbon dioxide increases, the blood become more acidic, the heart rate drops and blood vessels in the lung constrict.  The body is then alerted that something is very wrong. The sleeper must wake enough to reopen the airway. By this time, the heart is racing and experiences a false signal of fluid overload. The heart excretes a hormone-like protein that tells the body to get rid of sodium and water, resulting in nocturia.             - The patient may also benefit from a discussion with his primary care physician to see if she has risk factors for sleep apnea or other sleep disturbances and obtaining a sleep study - she does not want to undergo a sleep study at this time  4. Incontinence  - Patient states the majority of her incontinence occurs at night, they will speak to their primary care physician regarding a sleep study  - Patient is not a candidate for anticholinergics due to her age and history of urinary retention  - Patient is not a candidate for beta adenergic receptor agonist due to blood pressure issues  - I discussed non-pharmacological therapy such as PTNS, PNE and  Botox injections into the bladder  - She is interested in starting PTNS therapy - but had to discontinue treatment due to transportation issues  5. Urinary retention  - Patient voiding well on her   6. Urinary hesitancy  - encouraged the patient to increase her fluid intake  - will send urine for culture  - check BMP    No Follow-up on file.  These notes generated with voice recognition software.  I apologize for typographical errors.  Zara Council, [facility] [address], [place], [place] [address] [phone]

## 2016-07-09 LAB — BASIC METABOLIC PANEL
BUN / CREAT RATIO: 30 — AB (ref 12–28)
BUN: 20 mg/dL (ref 8–27)
CHLORIDE: 100 mmol/L (ref 96–106)
CO2: 23 mmol/L (ref 18–29)
Calcium: 9.9 mg/dL (ref 8.7–10.3)
Creatinine, Ser: 0.66 mg/dL (ref 0.57–1.00)
GFR calc Af Amer: 92 mL/min/{1.73_m2} (ref >59)
GFR calc non Af Amer: 80 mL/min/{1.73_m2} (ref >59)
GLUCOSE: 91 mg/dL (ref 65–99)
POTASSIUM: 4.3 mmol/L (ref 3.5–5.2)
SODIUM: 141 mmol/L (ref 134–144)

## 2016-07-11 LAB — CULTURE, URINE COMPREHENSIVE

## 2016-07-12 ENCOUNTER — Telehealth: Payer: Self-pay

## 2016-07-12 NOTE — Telephone Encounter (Signed)
-----   Message from Nori Riis, PA-C sent at 07/11/2016  8:01 PM EDT ----- Please add the request for further identification on the yeast isolate and notify the patient's daughter that her urine grew out yeast and we are waiting for this further identification.

## 2016-07-12 NOTE — Telephone Encounter (Signed)
Identifications were added.

## 2016-07-17 LAB — ORGANISM IDENTIFICATION, YEAST

## 2016-07-17 LAB — SPECIMEN STATUS REPORT

## 2016-07-18 ENCOUNTER — Other Ambulatory Visit: Payer: Self-pay | Admitting: Urology

## 2016-07-18 MED ORDER — FLUCONAZOLE 200 MG PO TABS: 200.0000 mg | ORAL_TABLET | Freq: Every day | ORAL | 0 refills | Status: DC

## 2016-07-19 ENCOUNTER — Telehealth: Payer: Self-pay

## 2016-07-19 NOTE — Telephone Encounter (Signed)
-----   Message from Nori Riis, PA-C sent at 07/18/2016  8:20 PM EDT ----- Patient has a +UCx.  They need to start fluconazole 200 mg,  one tablet daily for 14 days.  She will need to stop her cholesterol medication, Zocor (simvastatin), while she is on the fluconazole for 2 weeks to prevent rhabdomyolysis.    I have e scribed their prescription to they [facility].

## 2016-07-19 NOTE — Telephone Encounter (Signed)
Spoke with pt daughter in reference to +ucx. Made aware will need diflucan once daily x14 days and to stop zocor. Daughter voiced understanding.

## 2016-09-16 ENCOUNTER — Telehealth: Payer: Self-pay

## 2016-09-16 ENCOUNTER — Ambulatory Visit: Payer: Medicare Other

## 2016-09-16 VITALS — BP 149/66 | HR 83 | Ht 66.0 in | Wt 142.0 lb

## 2016-09-16 DIAGNOSIS — R339 Retention of urine, unspecified: Secondary | ICD-10-CM

## 2016-09-16 NOTE — Telephone Encounter (Signed)
Pt daughter called stating she thinks pt has a UTI. Daughter described s/s to pt having lower back pain, feeling achy, and no appetite the last couple of days. Made daughter aware pt would need a cath specimen and would have to come to the [place] office. Daughter voiced understanding. Pt was added to nurse schedule for this afternoon.

## 2016-09-16 NOTE — Progress Notes (Signed)
Patient present today c/o urinary frequency, hard to postpone urination, leakage of urine, back/flank pain. Cath for specimen. Specimen sent for Cx.        In and Out Catheterization  Patient is present today for a I & O catheterization due to possible UTI.  Patient was cleaned and prepped in a sterile fashion with betadine and Lidocaine 2% jelly was instilled into the urethra.  A 14FR cath was inserted no complications were noted , 238ml of urine return was noted, urine was yellow in color. A clean urine sample was collected for urinalysis. Bladder was drained  And catheter was removed with out difficulty.    Preformed by: C. Corinna Capra, CMA

## 2016-09-20 LAB — URINE CULTURE

## 2016-09-21 ENCOUNTER — Telehealth: Payer: Self-pay

## 2016-09-21 NOTE — Telephone Encounter (Signed)
-----   Message from Nori Riis, PA-C sent at 09/20/2016  2:34 PM EDT ----- Patient's urine culture results are inconclusive. If she is still symptomatic, we will need to repeat the catheter UA for culture.

## 2016-09-21 NOTE — Telephone Encounter (Signed)
Spoke with pt daughter in reference to ucx results. Daughter stated that pt is only c/o frequency. Daughter also stated that pt is taking lasix and pt is unsure if lasix is causing the frequency or not. Reinforced with daughter if pt develops n/v, f/c, dysuria to call back. Daughter voiced understanding.

## 2016-12-13 ENCOUNTER — Other Ambulatory Visit: Payer: Self-pay

## 2016-12-13 DIAGNOSIS — N952 Postmenopausal atrophic vaginitis: Secondary | ICD-10-CM

## 2016-12-13 MED ORDER — ESTRADIOL 0.1 MG/GM VA CREA: TOPICAL_CREAM | VAGINAL | 3 refills | Status: DC

## 2017-01-18 ENCOUNTER — Ambulatory Visit (INDEPENDENT_AMBULATORY_CARE_PROVIDER_SITE_OTHER): Payer: Medicare Other

## 2017-01-18 ENCOUNTER — Telehealth: Payer: Self-pay | Admitting: Urology

## 2017-01-18 VITALS — BP 138/80 | HR 94 | Ht 66.0 in | Wt 140.0 lb

## 2017-01-18 DIAGNOSIS — N390 Urinary tract infection, site not specified: Secondary | ICD-10-CM

## 2017-01-18 LAB — MICROSCOPIC EXAMINATION: RBC, UA: NONE SEEN /hpf (ref 0–?)

## 2017-01-18 LAB — URINALYSIS, COMPLETE
Bilirubin, UA: NEGATIVE
GLUCOSE, UA: NEGATIVE
KETONES UA: NEGATIVE
NITRITE UA: POSITIVE — AB
Protein, UA: NEGATIVE
SPEC GRAV UA: 1.01 (ref 1.005–1.030)
UUROB: 0.2 mg/dL (ref 0.2–1.0)
pH, UA: 6 (ref 5.0–7.5)

## 2017-01-18 MED ORDER — AMOXICILLIN-POT CLAVULANATE 875-125 MG PO TABS: 1.0000 | ORAL_TABLET | Freq: Two times a day (BID) | ORAL | 0 refills | Status: DC

## 2017-01-18 NOTE — Addendum Note (Signed)
Addended by: Zara Council A on: 01/18/2017 04:12 PM   Modules accepted: Orders

## 2017-01-18 NOTE — Progress Notes (Signed)
Pt presents today with c/o urinary frequency and urgency, hard to postpone urination, and dysuria. A cath specimen was obtained for u/a and cx.   Blood pressure 138/80, pulse 94, height 5\' 6"  (1.676 m), weight 140 lb (63.5 kg).

## 2017-01-18 NOTE — Telephone Encounter (Signed)
Please let Mrs. Gonia know that I have sent in Augmentin to the pharmacy for her.

## 2017-01-18 NOTE — Telephone Encounter (Signed)
Spoke with pt daughter and made aware abx were sent to pharmacy. Daughter voiced understanding.

## 2017-01-24 ENCOUNTER — Telehealth: Payer: Self-pay

## 2017-01-24 LAB — CULTURE, URINE COMPREHENSIVE

## 2017-01-24 NOTE — Telephone Encounter (Signed)
Spoke to patient's daughter [name]. Gave antibiotic confirmation per Larene Beach. Daughter verbalized understanding.

## 2017-01-24 NOTE — Telephone Encounter (Signed)
-----   Message from Nori Riis, PA-C sent at 01/24/2017  1:40 PM EDT ----- Please let Mrs. Mickler know that the Augmentin is the appropriate antibiotic for her infection.

## 2017-02-08 ENCOUNTER — Ambulatory Visit (INDEPENDENT_AMBULATORY_CARE_PROVIDER_SITE_OTHER): Payer: Medicare Other

## 2017-02-08 ENCOUNTER — Telehealth: Payer: Self-pay | Admitting: Urology

## 2017-02-08 VITALS — BP 165/84 | HR 93 | Ht 66.0 in | Wt 143.4 lb

## 2017-02-08 DIAGNOSIS — N390 Urinary tract infection, site not specified: Secondary | ICD-10-CM | POA: Diagnosis not present

## 2017-02-08 LAB — URINALYSIS, COMPLETE
Bilirubin, UA: NEGATIVE
GLUCOSE, UA: NEGATIVE
KETONES UA: NEGATIVE
LEUKOCYTES UA: NEGATIVE
Nitrite, UA: NEGATIVE
Protein, UA: NEGATIVE
RBC UA: NEGATIVE
SPEC GRAV UA: 1.01 (ref 1.005–1.030)
Urobilinogen, Ur: 0.2 mg/dL (ref 0.2–1.0)
pH, UA: 6.5 (ref 5.0–7.5)

## 2017-02-08 NOTE — Telephone Encounter (Signed)
Pt daughter called office asking for appt for her mother to get a Cath UA, says her mother is experiencing UTI symptoms which include frequent urination, uncomfortable, having pain when urinating, bottom is sore. I have added her to the nurse schedule after lunch. FYI

## 2017-02-08 NOTE — Progress Notes (Signed)
In and Out Catheterization  Patient is present today for a I & O catheterization due to possible UTI. Patient was cleaned and prepped in a sterile fashion with betadine and Lidocaine 2% jelly was instilled into the urethra.  A 14FR cath was inserted no complications were noted , 238ml of urine return was noted, urine was clear yellow in color. A clean urine sample was collected for UA and culture. Bladder was drained  And catheter was removed with out difficulty.    Preformed by: Fonnie Jarvis, CMA  Blood pressure (!) 165/84, pulse 93, height 5\' 6"  (1.676 m), weight 143 lb 6.4 oz (65 kg).  Pt UTI s/s are urinary frequency and urgency, hard to postpone urination, dysuria, leakage of urine.

## 2017-02-11 ENCOUNTER — Telehealth: Payer: Self-pay

## 2017-02-11 LAB — CULTURE, URINE COMPREHENSIVE

## 2017-02-11 NOTE — Telephone Encounter (Signed)
Spoke to daughter. Gave results. Pt still have sx(s). Scheduled appt for next week.

## 2017-02-11 NOTE — Telephone Encounter (Signed)
-----   Message from Nori Riis, PA-C sent at 02/11/2017  2:40 PM EDT ----- Please let Mrs. Lovering know that her urine culture was negative.  If she is still having symptoms, she will need an appointment.

## 2017-02-14 NOTE — Progress Notes (Signed)
3:02 PM   Christine Mclean [birth year] [id number]  Referring provider: Stoney Bang, MD [place], [place] [address]  Chief Complaint  Patient presents with  . Follow-up    uti sxs / culture negative Last seen 06/2016     HPI: Patient is an 83 year old Caucasian female who presents today for the symptoms of frequency, urgency, dysuria and incontinence with her daughter.  Patient presented to our office last week for a catheter UA and urine culture for these symptoms.  The UA was nitrate positive, but the urine culture was negative.      Today, she still complains of frequency, urgency, dysuria and nocturia and incontinence.  Her PVR is 134 mL.  She states that her vagina has burning and itching.    Recurrent urinary tract infections  Over the last year, patient has had 2 UTI's with pseudomonas and enterococcus.  The year prior she had 3 UTI's with pseudomonas and klebsiella.  She did undergo a CT with contrast in 01/2015 and no stones were visualized, kidneys were normal bilaterally and the bladder was unremarkable.  An abdominal film was taken in 01/2015 and no stones were seen on plain film.  Her risk factors for UTI's are age, vaginal atrophy, constipation and diarrhea.  Her UA today was unremarkable.    Atrophic vaginitis She has been applying the vaginal cream 3 nights weekly.  Nocturia Patient still experiencing nocturia 3-5 times nightly.  She tried discontinuing fluid intake after 6 PM, but she found no relief in her nocturia.  History of urinary retention Patient underwent a hip replacement and subsequently went into urinary retention. She failed a voiding trial on 2 occasions.   The facility does have the capability of CIC.  At this time, she is emptying her bladder.  Her PVR today is 134 mL.     PMH: Past Medical History:  Diagnosis Date  . Arthritis   . Back pain   . Heart disease   . Heart failure ([place])   . HLD  (hyperlipidemia)   . HTN (hypertension)   . Recurrent UTI   . Stroke (cerebrum) ([place])   . Uterine cancer [facility])     Surgical History: Past Surgical History:  Procedure Laterality Date  . ABDOMINAL HYSTERECTOMY  1972  . APPENDECTOMY  1950  . BACK SURGERY  2016  . SKIN CANCER DESTRUCTION  2016  . TOTAL HIP ARTHROPLASTY Bilateral    done seperately    Home Medications:  Allergies as of 02/15/2017      Reactions   Sulfa Antibiotics Nausea And Vomiting      Medication List       Accurate as of 02/15/17  3:02 PM. Always use your most recent med list.          acetaminophen 500 MG tablet Commonly known as:  TYLENOL Take by mouth.   alendronate 70 MG tablet Commonly known as:  FOSAMAX Take by mouth.   amLODipine 5 MG tablet Commonly known as:  NORVASC Take by mouth.   amoxicillin-clavulanate 875-125 MG tablet Commonly known as:  AUGMENTIN Take 1 tablet by mouth every 12 (twelve) hours.   amoxicillin-clavulanate 875-125 MG tablet Commonly known as:  AUGMENTIN Take 1 tablet by mouth every 12 (twelve) hours.   aspirin EC 81 MG tablet Take by mouth.   CALCIUM 600+D PLUS MINERALS 600-400 MG-UNIT Tabs Take by mouth.   Calcium Carbonate-Vitamin D 600-400 MG-UNIT tablet Take by mouth.  ciprofloxacin 500 MG tablet Commonly known as:  CIPRO Take 1 tablet (500 mg total) by mouth 2 (two) times daily.   D 2000 2000 units Tabs Generic drug:  Cholecalciferol Take by mouth.   estradiol 0.1 MG/GM vaginal cream Commonly known as:  ESTRACE VAGINAL Apply 0.5mg  (pea-sized amount)  just inside the vaginal introitus with a finger-tip every night for two weeks and then Monday, Wednesday and Friday nights.   ferrous sulfate 325 (65 FE) MG tablet Take by mouth.   fluconazole 200 MG tablet Commonly known as:  DIFLUCAN Take 1 tablet (200 mg total) by mouth daily.   furosemide 20 MG tablet Commonly known as:  LASIX Take 10 mg by mouth.   HYDROcodone-acetaminophen 5-325  MG tablet Commonly known as:  NORCO/VICODIN Take by mouth. Reported on 10/07/2015   losartan 25 MG tablet Commonly known as:  COZAAR Take by mouth.   MULTI-VITAMINS Tabs Take by mouth.   nitrofurantoin (macrocrystal-monohydrate) 100 MG capsule Commonly known as:  MACROBID Take 1 capsule (100 mg total) by mouth every 12 (twelve) hours.   NITROSTAT 0.4 MG SL tablet Generic drug:  nitroGLYCERIN PLACE 1 TABLET  UNDER THE TONGUE EVERY 5 (FIVE) MINUTES AS NEEDED FOR CHEST PAIN. MAY TAKE UP TO 3 DOSES.   polyethylene glycol packet Commonly known as:  MIRALAX / GLYCOLAX Take by mouth.   RA VITAMIN B-12 TR 1000 MCG Tbcr Generic drug:  Cyanocobalamin Take by mouth.   simvastatin 20 MG tablet Commonly known as:  ZOCOR Take 20 mg by mouth. Reported on 05/12/2015   valsartan 40 MG tablet Commonly known as:  DIOVAN Take by mouth.       Allergies:  Allergies  Allergen Reactions  . Sulfa Antibiotics Nausea And Vomiting    Family History: Family History  Problem Relation Age of Onset  . Kidney disease Neg Hx   . Bladder Cancer Neg Hx   . Kidney cancer Neg Hx   . Prostate cancer Neg Hx     Social History:  reports that she has never smoked. She has never used smokeless tobacco. She reports that she does not drink alcohol or use drugs.  ROS: UROLOGY Frequent Urination?: Yes Hard to postpone urination?: Yes Burning/pain with urination?: Yes Get up at night to urinate?: Yes Leakage of urine?: Yes Urine stream starts and stops?: No Trouble starting stream?: No Do you have to strain to urinate?: No Blood in urine?: No Urinary tract infection?: No Sexually transmitted disease?: No Injury to kidneys or bladder?: No Painful intercourse?: No Weak stream?: No Currently pregnant?: No Vaginal bleeding?: No Last menstrual period?: n  Gastrointestinal Nausea?: No Vomiting?: No Indigestion/heartburn?: No Diarrhea?: No Constipation?: No  Constitutional Fever: No Night  sweats?: No Weight loss?: No Fatigue?: Yes  Skin Skin rash/lesions?: No Itching?: Yes  Eyes Blurred vision?: No Double vision?: No  Ears/Nose/Throat Sore throat?: No Sinus problems?: No  Hematologic/Lymphatic Swollen glands?: No Easy bruising?: No  Cardiovascular Leg swelling?: Yes Chest pain?: No  Respiratory Cough?: No Shortness of breath?: No  Endocrine Excessive thirst?: No  Musculoskeletal Back pain?: No Joint pain?: No  Neurological Headaches?: No Dizziness?: No  Psychologic Depression?: No Anxiety?: No  Physical Exam: BP (!) 175/84   Pulse 82   Ht 5\' 6"  (1.676 m)   Wt 146 lb (66.2 kg)   BMI 23.57 kg/m   Constitutional: Well nourished. Alert and oriented, No acute distress. HEENT: [place] AT, moist mucus membranes. Trachea midline, no masses. Cardiovascular: No clubbing, cyanosis, or  edema. Respiratory: Normal respiratory effort, no increased work of breathing. GI: Abdomen is soft, non tender, non distended, no abdominal masses. Liver and spleen not palpable.  No hernias appreciated.  Stool sample for occul testing is not indicated.   GU: No CVA tenderness.  No bladder fullness or masses.  Atrophic external genitalia, irritated labia with lichenification, normal pubic hair distribution, no lesions.  Normal urethral meatus, no lesions, no prolapse, no discharge.   No urethral  tenderness.  Urethral caruncle is noted.  No bladder fullness, tenderness or masses. Atrophic vagina mucosa, narrowing of the vaginal vault, poor estrogen effect, no discharge, no lesions, good pelvic support, no cystocele or rectocele noted.  Uterus and cervix are surgically absent.  No pelvic masses noted. Anus and perineum are without rashes or lesions.    Skin: No rashes, bruises or suspicious lesions. Lymph: No cervical or inguinal adenopathy. Neurologic: Grossly intact, no focal deficits, moving all 4 extremities. Psychiatric: Normal mood and affect.  Laboratory Data: Lab  Results  Component Value Date   CREATININE 0.66 07/08/2016   I have reviewed the labs     Assessment & Plan:    1. History of recurrent UTI's  - not having the symptoms of UTI at this time - urine culture is negative  - reviewed symptoms of an UTI - fevers, gross hematuria, burning with urination  - reviewed preventative measures- cranberry tablets, Vitamin C, probiotics, drinking a lot of water  2. Atrophic vaginitis:  Patient will continue the vaginal estrogen cream 3 nights weekly- prescription given  3. Vaginal irritation  - due to pads - encouraged to use barrier cream after each changing of the pads whether or not she is having vaginal irritation symptoms at this time to keep in front of the vaginal irritation   - use coconut oil prn for irritation  Return in about 1 month (around 03/18/2017) for OAB questionnaire, PVR and exam.  These notes generated with voice recognition software. I apologize for typographical errors.  Zara Council, [facility] [address] [place], [place] [address] [phone]

## 2017-02-15 ENCOUNTER — Encounter: Payer: Self-pay | Admitting: Urology

## 2017-02-15 ENCOUNTER — Ambulatory Visit (INDEPENDENT_AMBULATORY_CARE_PROVIDER_SITE_OTHER): Payer: Medicare Other | Admitting: Urology

## 2017-02-15 VITALS — BP 175/84 | HR 82 | Ht 66.0 in | Wt 146.0 lb

## 2017-02-15 DIAGNOSIS — R350 Frequency of micturition: Secondary | ICD-10-CM

## 2017-02-15 DIAGNOSIS — N898 Other specified noninflammatory disorders of vagina: Secondary | ICD-10-CM | POA: Diagnosis not present

## 2017-02-15 DIAGNOSIS — Z87440 Personal history of urinary (tract) infections: Secondary | ICD-10-CM | POA: Diagnosis not present

## 2017-02-15 DIAGNOSIS — N952 Postmenopausal atrophic vaginitis: Secondary | ICD-10-CM

## 2017-02-15 LAB — BLADDER SCAN AMB NON-IMAGING: SCAN RESULT: 134

## 2017-02-15 NOTE — Patient Instructions (Signed)
  Use the "butt" paste every time you change your pads  Use the coconut oil when you are experiencing vaginal discomfort anytime  Use the vaginal estrogen cream on Monday, Wednesday and Friday nights.  Apply with your fingertip, not the applicator.

## 2017-03-18 NOTE — Progress Notes (Signed)
2:06 PM   Christine Mclean 12/29/1926 [id number]  Referring provider: Stoney Bang, MD [place], [place] [address]  Chief Complaint  Patient presents with  . Recurrent UTI    HPI: Patient is an 83 year old Caucasian female with a history of recurrent UTI's, vaginal atrophy, nocturia and history of urinary retention who presents today for a one month follow up.    Recurrent urinary tract infections  Over the last year, patient has had 2 UTI's with pseudomonas and enterococcus.  The year prior she had 3 UTI's with pseudomonas and klebsiella.  She did undergo a CT with contrast in 01/2015 and no stones were visualized, kidneys were normal bilaterally and the bladder was unremarkable.  An abdominal film was taken in 01/2015 and no stones were seen on plain film.  Her risk factors for UTI's are age, vaginal atrophy, constipation and diarrhea.  Today, she is complaining of urgency and nocturia.  Her CATH UA today was unremarkable.    Atrophic vaginitis She has been applying the vaginal cream 3 nights weekly.  She feels better today.  Less vaginal burning.    Nocturia Patient still experiencing nocturia 3-5 times nightly.  She tried discontinuing fluid intake after 6 PM, but she found no relief in her nocturia.  History of urinary retention Patient underwent a hip replacement and subsequently went into urinary retention. She failed a voiding trial on 2 occasions.   The facility does have the capability of CIC.  At this time, she is emptying her bladder.  Her PVR today is 300 mL.     PMH: Past Medical History:  Diagnosis Date  . Arthritis   . Back pain   . Heart disease   . Heart failure ([place])   . HLD (hyperlipidemia)   . HTN (hypertension)   . Recurrent UTI   . Stroke (cerebrum) ([place])   . Uterine cancer [facility])     Surgical History: Past Surgical History:  Procedure Laterality Date  . ABDOMINAL HYSTERECTOMY  1972  . APPENDECTOMY   1950  . BACK SURGERY  2016  . SKIN CANCER DESTRUCTION  2016  . TOTAL HIP ARTHROPLASTY Bilateral    done seperately    Home Medications:  Allergies as of 03/21/2017      Reactions   Sulfa Antibiotics Nausea And Vomiting      Medication List        Accurate as of 03/21/17  2:06 PM. Always use your most recent med list.          acetaminophen 500 MG tablet Commonly known as:  TYLENOL Take by mouth.   alendronate 70 MG tablet Commonly known as:  FOSAMAX Take by mouth.   amLODipine 5 MG tablet Commonly known as:  NORVASC Take by mouth.   amoxicillin-clavulanate 875-125 MG tablet Commonly known as:  AUGMENTIN Take 1 tablet by mouth every 12 (twelve) hours.   amoxicillin-clavulanate 875-125 MG tablet Commonly known as:  AUGMENTIN Take 1 tablet by mouth every 12 (twelve) hours.   aspirin EC 81 MG tablet Take by mouth.   CALCIUM 600+D PLUS MINERALS 600-400 MG-UNIT Tabs Take by mouth.   Calcium Carbonate-Vitamin D 600-400 MG-UNIT tablet Take by mouth.   ciprofloxacin 500 MG tablet Commonly known as:  CIPRO Take 1 tablet (500 mg total) by mouth 2 (two) times daily.   D 2000 2000 units Tabs Generic drug:  Cholecalciferol Take by mouth.   estradiol 0.1 MG/GM vaginal cream Commonly  known as:  ESTRACE VAGINAL Apply 0.5mg  (pea-sized amount)  just inside the vaginal introitus with a finger-tip every night for two weeks and then Monday, Wednesday and Friday nights.   ferrous sulfate 325 (65 FE) MG tablet Take by mouth.   fluconazole 200 MG tablet Commonly known as:  DIFLUCAN Take 1 tablet (200 mg total) by mouth daily.   furosemide 20 MG tablet Commonly known as:  LASIX Take 10 mg by mouth.   HYDROcodone-acetaminophen 5-325 MG tablet Commonly known as:  NORCO/VICODIN Take by mouth. Reported on 10/07/2015   losartan 25 MG tablet Commonly known as:  COZAAR Take by mouth.   MULTI-VITAMINS Tabs Take by mouth.   nitrofurantoin (macrocrystal-monohydrate) 100  MG capsule Commonly known as:  MACROBID Take 1 capsule (100 mg total) by mouth every 12 (twelve) hours.   NITROSTAT 0.4 MG SL tablet Generic drug:  nitroGLYCERIN PLACE 1 TABLET  UNDER THE TONGUE EVERY 5 (FIVE) MINUTES AS NEEDED FOR CHEST PAIN. MAY TAKE UP TO 3 DOSES.   polyethylene glycol packet Commonly known as:  MIRALAX / GLYCOLAX Take by mouth.   RA VITAMIN B-12 TR 1000 MCG Tbcr Generic drug:  Cyanocobalamin Take by mouth.   simvastatin 20 MG tablet Commonly known as:  ZOCOR Take 20 mg by mouth. Reported on 05/12/2015   valsartan 40 MG tablet Commonly known as:  DIOVAN Take by mouth.       Allergies:  Allergies  Allergen Reactions  . Sulfa Antibiotics Nausea And Vomiting    Family History: Family History  Problem Relation Age of Onset  . Kidney disease Neg Hx   . Bladder Cancer Neg Hx   . Kidney cancer Neg Hx   . Prostate cancer Neg Hx     Social History:  reports that  has never smoked. she has never used smokeless tobacco. She reports that she does not drink alcohol or use drugs.  ROS: UROLOGY Frequent Urination?: No Hard to postpone urination?: Yes Burning/pain with urination?: No Get up at night to urinate?: Yes Leakage of urine?: No Urine stream starts and stops?: No Trouble starting stream?: No Do you have to strain to urinate?: No Blood in urine?: No Urinary tract infection?: No Sexually transmitted disease?: No Injury to kidneys or bladder?: No Painful intercourse?: No Weak stream?: No Currently pregnant?: No Vaginal bleeding?: No Last menstrual period?: n  Gastrointestinal Nausea?: No Vomiting?: No Indigestion/heartburn?: No Diarrhea?: No Constipation?: No  Constitutional Fever: No Night sweats?: No Weight loss?: No Fatigue?: Yes  Skin Skin rash/lesions?: No Itching?: No  Eyes Blurred vision?: No Double vision?: No  Ears/Nose/Throat Sore throat?: No Sinus problems?: No  Hematologic/Lymphatic Swollen glands?:  No Easy bruising?: No  Cardiovascular Leg swelling?: Yes Chest pain?: No  Respiratory Cough?: No Shortness of breath?: Yes  Endocrine Excessive thirst?: No  Musculoskeletal Back pain?: No Joint pain?: No  Neurological Headaches?: No Dizziness?: No  Psychologic Depression?: No Anxiety?: No  Physical Exam: BP (!) 161/73   Pulse 88   Ht 5\' 5"  (1.651 m)   Wt 142 lb (64.4 kg)   BMI 23.63 kg/m   Constitutional: Well nourished. Alert and oriented, No acute distress. HEENT: [place] AT, moist mucus membranes. Trachea midline, no masses. Cardiovascular: No clubbing, cyanosis, or edema. Respiratory: Normal respiratory effort, no increased work of breathing. GI: Abdomen is soft, non tender, non distended, no abdominal masses. Liver and spleen not palpable.  No hernias appreciated.  Stool sample for occult testing is not indicated.   GU: No  CVA tenderness.  No bladder fullness or masses.  Atrophic external genitalia, normal pubic hair distribution, no lesions.  Normal urethral meatus, no lesions, no prolapse, no discharge.   No urethral  tenderness.  Urethral caruncle is noted.  No bladder fullness, tenderness or masses. Atrophic vagina mucosa, narrowing of the vaginal vault, poor estrogen effect, no discharge, no lesions, good pelvic support, no cystocele or rectocele noted.  Uterus and cervix are surgically absent.  No pelvic masses noted. Anus and perineum are without rashes or lesions.    Skin: No rashes, bruises or suspicious lesions. Lymph: No cervical or inguinal adenopathy. Neurologic: Grossly intact, no focal deficits, moving all 4 extremities. Psychiatric: Normal mood and affect.  Laboratory Data: Lab Results  Component Value Date   CREATININE 0.66 07/08/2016   I have reviewed the labs     Assessment & Plan:    1. History of recurrent UTI's  - not having the symptoms of UTI at this time - CATH UA is negative  - reviewed symptoms of an UTI - fevers, gross hematuria,  burning with urination  - reviewed preventative measures- cranberry tablets, Vitamin C, probiotics, drinking a lot of water  2. Atrophic vaginitis:  Patient will continue the vaginal estrogen cream 3 nights weekly  3. Vaginal irritation  - due to pads - encouraged to use barrier cream after each changing of the pads whether or not she is having vaginal irritation symptoms at this time to keep in front of the vaginal irritation   - use coconut oil prn for irritation  4. History of urinary retention  - moderate PVR today - facility has the ability to cath if patient is unable to urinate  Return in about 1 year (around 03/21/2018) for OAB questionnaire, PVR and exam.  These notes generated with voice recognition software. I apologize for typographical errors.  Zara Council, [facility] [address] [place], [place] [address] [phone]

## 2017-03-21 ENCOUNTER — Encounter: Payer: Self-pay | Admitting: Urology

## 2017-03-21 ENCOUNTER — Ambulatory Visit: Payer: Medicare Other | Admitting: Urology

## 2017-03-21 VITALS — BP 161/73 | HR 88 | Ht 65.0 in | Wt 142.0 lb

## 2017-03-21 DIAGNOSIS — Z87440 Personal history of urinary (tract) infections: Secondary | ICD-10-CM

## 2017-03-21 DIAGNOSIS — Z87898 Personal history of other specified conditions: Secondary | ICD-10-CM | POA: Diagnosis not present

## 2017-03-21 DIAGNOSIS — N952 Postmenopausal atrophic vaginitis: Secondary | ICD-10-CM

## 2017-03-21 DIAGNOSIS — R351 Nocturia: Secondary | ICD-10-CM

## 2017-03-21 LAB — MICROSCOPIC EXAMINATION

## 2017-03-21 LAB — URINALYSIS, COMPLETE
Bilirubin, UA: NEGATIVE
Glucose, UA: NEGATIVE
KETONES UA: NEGATIVE
LEUKOCYTES UA: NEGATIVE
NITRITE UA: NEGATIVE
PH UA: 5.5 (ref 5.0–7.5)
Protein, UA: NEGATIVE
RBC, UA: NEGATIVE
SPEC GRAV UA: 1.005 (ref 1.005–1.030)
Urobilinogen, Ur: 0.2 mg/dL (ref 0.2–1.0)

## 2017-03-21 NOTE — Progress Notes (Signed)
In and Out Catheterization  Patient is present today for a I & O catheterization due to recurrent UTI. Patient was cleaned and prepped in a sterile fashion with betadine and Lidocaine 2% jelly was instilled into the urethra.  A 14FR cath was inserted no complications were noted , 345ml of urine return was noted, urine was clear yellow in color. A clean urine sample was collected for UA. Bladder was drained  And catheter was removed with out difficulty.    Preformed by: Fonnie Jarvis, CMA

## 2017-12-28 ENCOUNTER — Ambulatory Visit (INDEPENDENT_AMBULATORY_CARE_PROVIDER_SITE_OTHER): Payer: Medicare Other

## 2017-12-28 DIAGNOSIS — Z87440 Personal history of urinary (tract) infections: Secondary | ICD-10-CM | POA: Diagnosis not present

## 2017-12-28 DIAGNOSIS — N952 Postmenopausal atrophic vaginitis: Secondary | ICD-10-CM | POA: Diagnosis not present

## 2017-12-28 DIAGNOSIS — R410 Disorientation, unspecified: Secondary | ICD-10-CM

## 2017-12-28 MED ORDER — AMOXICILLIN-POT CLAVULANATE 875-125 MG PO TABS: 1.0000 | ORAL_TABLET | Freq: Two times a day (BID) | ORAL | 0 refills | Status: DC

## 2017-12-28 MED ORDER — ESTRADIOL 0.1 MG/GM VA CREA: TOPICAL_CREAM | VAGINAL | 3 refills | Status: DC

## 2017-12-28 NOTE — Addendum Note (Signed)
Addended by: Tommy Rainwater on: 12/28/2017 04:58 PM   Modules accepted: Orders

## 2017-12-28 NOTE — Progress Notes (Addendum)
Patient present today complaining of confusion and dysuria .   In and Out Catheterization  Patient is present today for a I & O catheterization due to possible UTI. Patient was cleaned and prepped in a sterile fashion with betadine and Lidocaine 2% jelly was instilled into the urethra.  A 14FR cath was inserted no complications were noted , .ml of urine return was noted, urine was cloudy yellow in color. A clean urine sample was collected for UA and culture. Bladder was drained  And catheter was removed with out difficulty.    Preformed by: Fonnie Jarvis, CMA  Follow up/ Additional notes: Per Larene Beach urine today frankly positive for infection, will send in abx to pharm and will call with culture results Patient's daughter notified

## 2017-12-29 LAB — URINALYSIS, COMPLETE
Bilirubin, UA: NEGATIVE
Glucose, UA: NEGATIVE
Ketones, UA: NEGATIVE
Nitrite, UA: POSITIVE — AB
PH UA: 7.5 (ref 5.0–7.5)
RBC UA: NEGATIVE
Specific Gravity, UA: 1.015 (ref 1.005–1.030)
Urobilinogen, Ur: 0.2 mg/dL (ref 0.2–1.0)

## 2017-12-29 LAB — MICROSCOPIC EXAMINATION
RBC, UA: NONE SEEN /hpf (ref 0–2)
WBC, UA: >30 /hpf — ABNORMAL HIGH (ref 0–5)

## 2018-01-01 LAB — CULTURE, URINE COMPREHENSIVE

## 2018-01-27 ENCOUNTER — Ambulatory Visit (INDEPENDENT_AMBULATORY_CARE_PROVIDER_SITE_OTHER): Payer: Medicare Other | Admitting: Family Medicine

## 2018-01-27 ENCOUNTER — Encounter: Payer: Self-pay | Admitting: Family Medicine

## 2018-01-27 VITALS — BP 172/90 | HR 78 | Ht 65.0 in

## 2018-01-27 DIAGNOSIS — Z87440 Personal history of urinary (tract) infections: Secondary | ICD-10-CM | POA: Diagnosis not present

## 2018-01-27 LAB — URINALYSIS, COMPLETE
Bilirubin, UA: NEGATIVE
GLUCOSE, UA: NEGATIVE
Ketones, UA: NEGATIVE
NITRITE UA: NEGATIVE
Protein, UA: NEGATIVE
RBC, UA: NEGATIVE
Specific Gravity, UA: 1.01 (ref 1.005–1.030)
UUROB: 0.2 mg/dL (ref 0.2–1.0)
pH, UA: 6 (ref 5.0–7.5)

## 2018-01-27 LAB — MICROSCOPIC EXAMINATION: RBC MICROSCOPIC, UA: NONE SEEN /HPF (ref 0–2)

## 2018-01-27 NOTE — Progress Notes (Signed)
In and Out Catheterization  Patient is present today for a I & O catheterization due to recurrent UTI. Patient was cleaned and prepped in a sterile fashion with betadine and Lidocaine 2% jelly was instilled into the urethra.  A 14FR cath was inserted no complications were noted , 60ml of urine return was noted, urine was yellow in color. A clean urine sample was collected for UA, UCX. Bladder was drained  And catheter was removed with out difficulty.    Preformed by: Tyrice Hewitt Moustafa, CMA   

## 2018-02-01 ENCOUNTER — Telehealth: Payer: Self-pay | Admitting: Urology

## 2018-02-01 NOTE — Telephone Encounter (Signed)
Daughter called asking for urine results, please advise [name] at [phone]. Thanks

## 2018-02-02 ENCOUNTER — Telehealth: Payer: Self-pay | Admitting: Family Medicine

## 2018-02-02 LAB — CULTURE, URINE COMPREHENSIVE

## 2018-02-02 NOTE — Telephone Encounter (Signed)
Patient's daughter notified and she is asking for a different ABX. She states Augmentin is making her feel real sick

## 2018-02-02 NOTE — Telephone Encounter (Signed)
RX sent

## 2018-02-02 NOTE — Telephone Encounter (Signed)
We can send in a script for Macrobid 100 mg, bid for five days.

## 2018-02-02 NOTE — Telephone Encounter (Signed)
-----   Message from Nori Riis, PA-C sent at 02/02/2018 10:17 AM EDT ----- Please let Mrs. Lobello know that her urine culture is finalized and it is positive for infection and the Augmentin is the appropriate antibiotic.  She has had two UTI's back to back.  She should have an office visit in the near future.

## 2018-02-03 ENCOUNTER — Other Ambulatory Visit: Payer: Self-pay | Admitting: Family Medicine

## 2018-02-03 MED ORDER — NITROFURANTOIN MONOHYD MACRO 100 MG PO CAPS: 100.0000 mg | ORAL_CAPSULE | Freq: Two times a day (BID) | ORAL | 0 refills | Status: DC

## 2018-02-09 ENCOUNTER — Telehealth: Payer: Self-pay | Admitting: Urology

## 2018-02-09 NOTE — Telephone Encounter (Signed)
Pt daughter lmom asking to have Shannon's nurse to call her back, no details given. Please advise.

## 2018-02-10 NOTE — Telephone Encounter (Signed)
Spoke to patient's daughter is concerned about bladder leakage and wants to find out why this is suddenly happening.

## 2018-02-14 ENCOUNTER — Ambulatory Visit: Payer: Medicare Other | Admitting: Urology

## 2018-02-14 ENCOUNTER — Encounter: Payer: Self-pay | Admitting: Urology

## 2018-02-14 VITALS — BP 179/80 | HR 77 | Ht 66.0 in | Wt 145.9 lb

## 2018-02-14 DIAGNOSIS — Z87898 Personal history of other specified conditions: Secondary | ICD-10-CM

## 2018-02-14 DIAGNOSIS — N3946 Mixed incontinence: Secondary | ICD-10-CM | POA: Diagnosis not present

## 2018-02-14 DIAGNOSIS — N952 Postmenopausal atrophic vaginitis: Secondary | ICD-10-CM | POA: Diagnosis not present

## 2018-02-14 DIAGNOSIS — Z87440 Personal history of urinary (tract) infections: Secondary | ICD-10-CM | POA: Diagnosis not present

## 2018-02-14 DIAGNOSIS — M25569 Pain in unspecified knee: Secondary | ICD-10-CM | POA: Insufficient documentation

## 2018-02-14 DIAGNOSIS — M6281 Muscle weakness (generalized): Secondary | ICD-10-CM | POA: Insufficient documentation

## 2018-02-14 LAB — URINALYSIS, COMPLETE
Bilirubin, UA: NEGATIVE
Glucose, UA: NEGATIVE
Ketones, UA: NEGATIVE
Nitrite, UA: POSITIVE — AB
Protein, UA: NEGATIVE
Specific Gravity, UA: 1.01 (ref 1.005–1.030)
Urobilinogen, Ur: 0.2 mg/dL (ref 0.2–1.0)
pH, UA: 5.5 (ref 5.0–7.5)

## 2018-02-14 LAB — MICROSCOPIC EXAMINATION
EPITHELIAL CELLS (NON RENAL): NONE SEEN /HPF (ref 0–10)
RBC, UA: NONE SEEN /hpf (ref 0–2)

## 2018-02-14 LAB — BLADDER SCAN AMB NON-IMAGING

## 2018-02-14 NOTE — Progress Notes (Signed)
11:34 AM   Christine Mclean Nov 21, 1926 [id number]  Referring provider: Stoney Bang, MD No address on file  No chief complaint on file.   HPI: Patient is an 84 year old Caucasian female with a history of recurrent UTI's, vaginal atrophy, nocturia and history of urinary retention who presents today for follow up with her daughter, Christine Mclean.    rUTI's Risk factors: age, vaginal atrophy, dementia, constipation and incontinence.  RUS in 2017 was normal.     Urine culture positive for E. coli on January 27, 2018 Urine culture positive for Proteus Mirabella's resistant to nitrofurantoin and tetracycline on December 28, 2017 Urine culture positive for E. coli on October 07, 2017  Today, she is complaining of frequency, urgency, nocturia and incontinence.  She is also experiencing constipation.  She feels that she has another UTI today.  Her CATH UA is nitrite positive, > 30 WBC's and large amount of bacteria.    Atrophic vaginitis She is not applying the vaginal estrogen cream.    Nocturia Unchanged.   History of urinary retention PVR is 75 mL.  The patient has been experiencing urgency x 4-7 (unchanged), frequency x 4-7 (unchanged), not restricting fluids to avoid visits to the restroom, is engaging in toilet mapping, incontinence x 4-7 (unchanged) and nocturia x 0-3 (unchanged).   Her PVR is 75 mL.  Her main complaints today are frequency, urgency, nocturia and incontinence.   PMH: Past Medical History:  Diagnosis Date  . Arthritis   . Back pain   . Heart disease   . Heart failure ([place])   . HLD (hyperlipidemia)   . HTN (hypertension)   . Recurrent UTI   . Stroke (cerebrum) ([place])   . Uterine cancer [facility])     Surgical History: Past Surgical History:  Procedure Laterality Date  . ABDOMINAL HYSTERECTOMY  1972  . APPENDECTOMY  1950  . BACK SURGERY  2016  . SKIN CANCER DESTRUCTION  2016  . TOTAL HIP ARTHROPLASTY Bilateral    done seperately    Home  Medications:  Allergies as of 02/14/2018      Reactions   Sulfa Antibiotics Nausea And Vomiting      Medication List        Accurate as of 02/14/18 11:34 AM. Always use your most recent med list.          acetaminophen 500 MG tablet Commonly known as:  TYLENOL Take by mouth.   amLODipine 5 MG tablet Commonly known as:  NORVASC Take by mouth.   aspirin EC 81 MG tablet Take by mouth.   CALCIUM 600+D PLUS MINERALS 600-400 MG-UNIT Tabs Take by mouth.   Calcium Carbonate-Vitamin D 600-400 MG-UNIT tablet Take by mouth.   D 2000 2000 units Tabs Generic drug:  Cholecalciferol Take by mouth.   ferrous sulfate 325 (65 FE) MG tablet Take by mouth.   furosemide 20 MG tablet Commonly known as:  LASIX Take 10 mg by mouth.   HYDROcodone-acetaminophen 5-325 MG tablet Commonly known as:  NORCO/VICODIN Take by mouth. Reported on 10/07/2015   losartan 25 MG tablet Commonly known as:  COZAAR Take by mouth.   MULTI-VITAMINS Tabs Take by mouth.   NITROSTAT 0.4 MG SL tablet Generic drug:  nitroGLYCERIN PLACE 1 TABLET  UNDER THE TONGUE EVERY 5 (FIVE) MINUTES AS NEEDED FOR CHEST PAIN. MAY TAKE UP TO 3 DOSES.   polyethylene glycol packet Commonly known as:  MIRALAX / GLYCOLAX Take by mouth.   RA VITAMIN B-12 TR  1000 MCG Tbcr Generic drug:  Cyanocobalamin Take by mouth.       Allergies:  Allergies  Allergen Reactions  . Sulfa Antibiotics Nausea And Vomiting    Family History: Family History  Problem Relation Age of Onset  . Kidney disease Neg Hx   . Bladder Cancer Neg Hx   . Kidney cancer Neg Hx   . Prostate cancer Neg Hx     Social History:  reports that she has never smoked. She has never used smokeless tobacco. She reports that she does not drink alcohol or use drugs.  ROS: UROLOGY Frequent Urination?: Yes Hard to postpone urination?: Yes Burning/pain with urination?: No Get up at night to urinate?: Yes Leakage of urine?: Yes Urine stream starts and  stops?: No Trouble starting stream?: No Do you have to strain to urinate?: No Blood in urine?: No Urinary tract infection?: Yes Sexually transmitted disease?: No Injury to kidneys or bladder?: No Painful intercourse?: No Weak stream?: No Currently pregnant?: No Vaginal bleeding?: No Last menstrual period?: n  Gastrointestinal Nausea?: No Vomiting?: No Indigestion/heartburn?: No Diarrhea?: No Constipation?: Yes  Constitutional Fever: No Night sweats?: No Weight loss?: No Fatigue?: No  Skin Skin rash/lesions?: No Itching?: No  Eyes Blurred vision?: No Double vision?: No  Ears/Nose/Throat Sore throat?: No Sinus problems?: No  Hematologic/Lymphatic Swollen glands?: No Easy bruising?: Yes  Cardiovascular Leg swelling?: Yes Chest pain?: No  Respiratory Cough?: No Shortness of breath?: No  Endocrine Excessive thirst?: No  Musculoskeletal Back pain?: No Joint pain?: Yes  Neurological Headaches?: No Dizziness?: No  Psychologic Depression?: No Anxiety?: No  Physical Exam: BP (!) 179/80 (BP Location: Left Arm, Patient Position: Sitting, Cuff Size: Normal)   Pulse 77   Ht 5\' 6"  (1.676 m)   Wt 145 lb 14.4 oz (66.2 kg)   BMI 23.55 kg/m   Constitutional: Well nourished. Alert and oriented, No acute distress. HEENT: [place] AT, moist mucus membranes. Trachea midline, no masses. Cardiovascular: No clubbing, cyanosis, or edema. Respiratory: Normal respiratory effort, no increased work of breathing. GI: Abdomen is soft, non tender, non distended, no abdominal masses. Liver and spleen not palpable.  No hernias appreciated.  Stool sample for occult testing is not indicated.   GU: No CVA tenderness.  No bladder fullness or masses.  Atrophic external genitalia, normal pubic hair distribution, no lesions.  Normal urethral meatus, no lesions, no prolapse, no discharge.   No urethral masses, tenderness and/or tenderness. No bladder fullness, tenderness or masses.  Narrow introitus, pale vagina mucosa, poor estrogen effect, no discharge, no lesions, fair pelvic support, no cystocele or rectocele noted.  Cervix, uterus and adnexa are surgically absent. Anus and perineum are without rashes or lesions.    Skin: No rashes, bruises or suspicious lesions. Lymph: No cervical or inguinal adenopathy. Neurologic: Grossly intact, no focal deficits, moving all 4 extremities. Psychiatric: Normal mood and affect.  Laboratory Data: Lab Results  Component Value Date   CREATININE 0.66 07/08/2016   I have reviewed the labs  Pertinent Imaging Results for Christine Mclean, Christine Mclean (MRN [id number]) as of 02/20/2018 06:05  Ref. Range 02/14/2018 11:12  Scan Result Unknown 20ml     Assessment & Plan:    1. History of recurrent UTI's Preventative stragities difficult due to patient's memory issues  2. Atrophic vaginitis  Not applying the vaginal estrogen cream  3. History of urinary retention Resolved  4. Mixed incontinence Discussed behavioral therapies, bladder training and bladder control strategies Not a candidate for anticholinergic medications due  to age and memory issues Not a candidate for Myrbetriq due to uncontrolled HTN explained the PTNS provides treatment by indirectly providing electrical stimulation to the nerves responsible for bladder and pelvic floor function - a needle electrode generates an adjustable electrical pulse that travels to the sacral plexus via the tibial nerve which is located in the ankle, among other functions, the sacral nerve plexus regulates bladder and pelvic floor function - treatment protocol requires once-a-week treatments for 12 weeks, 30 minutes per session and many patients begin to see improvements by the 6th treatment. Patients who respond to treatment may require occasional treatments (~ once every 3 weeks) to sustain improvements. PTNS is a low-risk procedure. The most common side-effects with PTNS treatment are  temporary and minor, resulting from the placement of the needle electrode. They include minor bleeding, mild pain and skin inflammation and patients have seen up to an 80% success rate with this form of treatment  - RTC for PTNS   Return for schedule for PTNS .  These notes generated with voice recognition software. I apologize for typographical errors.  Zara Council, PA-C  [facility] [address] [place], [place] [address] [phone]

## 2018-02-16 ENCOUNTER — Ambulatory Visit: Payer: Medicare Other

## 2018-02-17 LAB — CULTURE, URINE COMPREHENSIVE

## 2018-02-20 ENCOUNTER — Ambulatory Visit (INDEPENDENT_AMBULATORY_CARE_PROVIDER_SITE_OTHER): Payer: Medicare Other

## 2018-02-20 DIAGNOSIS — N3946 Mixed incontinence: Secondary | ICD-10-CM

## 2018-02-20 NOTE — Progress Notes (Signed)
PTNS  Session # 1  Health & Social Factors: Patient is on fluid pill Caffeine: 2/day Alcohol: None Daytime voids #per day: Approx 10 Night-time voids #per night: 3-4 Urgency: Strong Incontinence Episodes #per day: 5 Ankle used: Right Treatment Setting: 17 Feeling/ Response: Sensory Comments: Pt today with leg edema in both legs, states she has not taken fluid pill yet today. Consent form signed today  Preformed By: Gordy Clement, CMA (AAMA)   Follow Up: As scheduled weekly, pt prefers [place]

## 2018-02-27 ENCOUNTER — Ambulatory Visit (INDEPENDENT_AMBULATORY_CARE_PROVIDER_SITE_OTHER): Payer: Medicare Other | Admitting: Urology

## 2018-02-27 DIAGNOSIS — N3946 Mixed incontinence: Secondary | ICD-10-CM | POA: Diagnosis not present

## 2018-02-27 NOTE — Progress Notes (Signed)
PTNS  Session # 2  Health & Social Factors: Patient is on a fluid pill Caffeine: 2/daily Alcohol: None Daytime voids #per day: 10 Night-time voids #per night: 3-4 Urgency: Strong  Incontinence Episodes #per day: 5 Ankle used: Right Treatment Setting: 10 Feeling/ Response: None, but patient with pitting edema in the LE   Preformed By: Zara Council, PA-C   Follow Up: One week

## 2018-03-06 ENCOUNTER — Ambulatory Visit: Payer: Medicare Other | Admitting: Urology

## 2018-03-07 ENCOUNTER — Ambulatory Visit (INDEPENDENT_AMBULATORY_CARE_PROVIDER_SITE_OTHER): Payer: Medicare Other | Admitting: Family Medicine

## 2018-03-07 DIAGNOSIS — N3946 Mixed incontinence: Secondary | ICD-10-CM | POA: Diagnosis not present

## 2018-03-07 NOTE — Progress Notes (Signed)
PTNS  Session # 2  Health & Social Factors: no change Caffeine: 2 Alcohol: 0 Daytime voids #per day: 6 Night-time voids #per night: 3 Urgency: Strong Incontinence Episodes #per day: 4 Ankle used: left Treatment Setting: 8 Feeling/ Response: both Comments: Patient tolerated well  Preformed By: Elberta Leatherwood, CMA  Follow Up: 1 week

## 2018-03-13 ENCOUNTER — Ambulatory Visit (INDEPENDENT_AMBULATORY_CARE_PROVIDER_SITE_OTHER): Payer: Medicare Other | Admitting: Urology

## 2018-03-13 DIAGNOSIS — N3946 Mixed incontinence: Secondary | ICD-10-CM

## 2018-03-13 NOTE — Progress Notes (Signed)
PTNS  Session # 3  Health & Social Factors: no change Caffeine: 2 Alcohol: 0 Daytime voids #per day: 7 Night-time voids #per night: 3 Urgency: mild Incontinence Episodes #per day: 0-1 Ankle used: right Treatment Setting: 7 Feeling/ Response: both Comments: none  Preformed By: Fonnie Jarvis, CMA   Follow Up: next week

## 2018-03-20 ENCOUNTER — Encounter: Payer: Self-pay | Admitting: Urology

## 2018-03-20 ENCOUNTER — Other Ambulatory Visit
Admission: RE | Admit: 2018-03-20 | Discharge: 2018-03-20 | Disposition: A | Discharge status: Home / Self Care | Payer: Medicare Other | Source: Ambulatory Visit | Attending: Urology | Admitting: Urology

## 2018-03-20 ENCOUNTER — Other Ambulatory Visit: Payer: Self-pay

## 2018-03-20 ENCOUNTER — Ambulatory Visit (INDEPENDENT_AMBULATORY_CARE_PROVIDER_SITE_OTHER): Payer: Medicare Other | Admitting: Urology

## 2018-03-20 VITALS — BP 153/77 | HR 80 | Wt 145.0 lb

## 2018-03-20 DIAGNOSIS — N3946 Mixed incontinence: Secondary | ICD-10-CM | POA: Diagnosis not present

## 2018-03-20 NOTE — Progress Notes (Signed)
PTNS  Session # 5  Health & Social Factors: no change Caffeine: 2 cups a day Alcohol: none Daytime voids #per day: 7 Night-time voids #per night: 3 Urgency: stong Incontinence Episodes #per day: 1 Ankle used: left Treatment Setting: 9 Feeling/ Response: both Comments: Patient tolerated well.  Preformed By: Elizabeth Palau, CMA(AAMA)  Follow Up: as directed  In and Out Catheterization  Patient is present today for a I & O catheterization due to urinary frequency and forgetfullness. Patient was cleaned and prepped in a sterile fashion with betadine and Lidocaine 2% jelly was instilled into the urethra.  A 14FR cath was inserted no complications were noted , 33ml of urine return was noted, urine was yellow in color. A clean urine sample was collected.  Bladder was drained  And catheter was removed with out difficulty.    Preformed by: Elizabeth Palau, CMA(AAMA)  Follow up/ Additional notes: we will contact patient with results.

## 2018-03-21 ENCOUNTER — Ambulatory Visit: Payer: Medicare Other | Admitting: Urology

## 2018-03-21 ENCOUNTER — Telehealth: Payer: Self-pay

## 2018-03-21 MED ORDER — AMOXICILLIN-POT CLAVULANATE 875-125 MG PO TABS: 1.0000 | ORAL_TABLET | Freq: Two times a day (BID) | ORAL | 0 refills | Status: DC

## 2018-03-21 NOTE — Telephone Encounter (Signed)
-----   Message from Nori Riis, PA-C sent at 03/21/2018  4:28 PM EST ----- Please have Mrs. Muscato start Augmentin 875/125, one twice daily for seven days.

## 2018-03-22 ENCOUNTER — Telehealth: Payer: Self-pay

## 2018-03-22 LAB — URINE CULTURE: SPECIAL REQUESTS: NORMAL

## 2018-03-22 MED ORDER — CIPROFLOXACIN HCL 250 MG PO TABS: 250.0000 mg | ORAL_TABLET | Freq: Two times a day (BID) | ORAL | 0 refills | Status: DC

## 2018-03-22 NOTE — Telephone Encounter (Signed)
Daughter was made aware. Called pharmacy and informed them of the correct rx to fill because daughter did not pick rx that was sent yesterday. She had no additional questions at this time. Nothing further is needed.

## 2018-03-22 NOTE — Telephone Encounter (Signed)
-----   Message from Nori Riis, PA-C sent at 03/22/2018  7:56 AM EST ----- Please let Christine Mclean and her daughter know that her urine culture results are finalized.  The Klebsiella bacteria is resistant to the Augmentin.  She will need to stop that antibiotic and start Cipro 250 mg, twice daily for seven days.

## 2018-03-27 ENCOUNTER — Ambulatory Visit (INDEPENDENT_AMBULATORY_CARE_PROVIDER_SITE_OTHER): Payer: Medicare Other | Admitting: Urology

## 2018-03-27 DIAGNOSIS — N3946 Mixed incontinence: Secondary | ICD-10-CM

## 2018-03-27 NOTE — Progress Notes (Signed)
PTNS  Session # 6  Health & Social Factors: no change Caffeine: 2 cups of coffee a day, 2 to 3 glassed of sweet tea daily and occasional soda twice a year Alcohol: none Daytime voids #per day: 7 - 10 Night-time voids #per night: 1 - 3 Urgency: moderate Incontinence Episodes #per day: 1-2 Ankle used: left Treatment Setting: 10 Feeling/ Response: both Comments: Patient tolerated well.  Preformed By: Zara Council, PA-C  Follow Up: as directed

## 2018-03-30 NOTE — Progress Notes (Signed)
PTNS  Session # 7  Health & Social Factors: no change Caffeine: 2 cups of coffee a day, 2-3 glasses of sweat tea daily and ocassional soda Alcohol: none Daytime voids #per day: 7-10 Night-time voids #per night: 3 Urgency:severe Incontinence Episodes #per day: 2 Ankle used: right Treatment Setting: 2 Feeling/ Response: patient felt Comment: patient does not feel this is working  Preformed By: Elizabeth Palau, [place])

## 2018-04-03 ENCOUNTER — Ambulatory Visit (INDEPENDENT_AMBULATORY_CARE_PROVIDER_SITE_OTHER): Payer: Medicare Other | Admitting: Urology

## 2018-04-03 ENCOUNTER — Other Ambulatory Visit: Payer: Self-pay

## 2018-04-03 DIAGNOSIS — N3946 Mixed incontinence: Secondary | ICD-10-CM

## 2018-04-03 NOTE — Patient Instructions (Signed)
Patient Name:______________________________________  Tracking your bladder symptoms Sample: Day Daytime Voids Number of Accidents Nighttime voids Urgenty for the day (0-4) Comments  Mon IIII 4 I 1 II 2 2 0=none-4=severe I had more coffee than usual today.   Week Starting:____________________________________ Day Daytime Voids Number of Accidents Nighttime voids Urgency for the day (0-4) Comments                                                                        This week my symptoms were:  O much better  O better O the same O worse  Week Starting:____________________________________ Day Daytime Voids Number of Accidents Nighttime voids Urgency for the day (0-4) Comments                                                                        This week my symptoms were:  O much better  O better O the same O worse

## 2018-04-10 ENCOUNTER — Ambulatory Visit: Payer: Medicare Other

## 2018-04-10 DIAGNOSIS — N3946 Mixed incontinence: Secondary | ICD-10-CM

## 2018-04-10 NOTE — Progress Notes (Signed)
PTNS  Session # 8  Health & Social Factors: no change Caffeine: 2 cups of coffee a day, 2-3 glasses of sweat tea daily and ocassional soda Alcohol: none Daytime voids #per day: 7-10 Night-time voids #per night: 3 Urgency: severe Incontinence Episodes #per day: 2 Ankle used: left Treatment Setting: 5 Feeling/ Response: patient felt Comments: patient says she is trying to drink fluids all the time, but it makes her void more.  Advised patient she can stop drinking fluids after dinner and that can help with nighttime activity.  Patient also says that the moment her feet hit the floor at night she starts urinating.  Preformed By: Elizabeth Palau, CMA(AAMA)

## 2018-04-17 ENCOUNTER — Ambulatory Visit: Payer: Medicare Other | Admitting: Urology

## 2018-04-30 NOTE — Progress Notes (Signed)
PTNS  Session # 10  Health & Social Factors: Recent UTI Caffeine: 1-2 cups of coffee, cut back on tea Alcohol: none Daytime voids #per day: 6-7 Night-time voids #per night: 2-3 Urgency: moderate-severe Incontinence Episodes #per day: 1-3 Ankle used: left side, (right was swollen) Treatment Setting: 16 Feeling/ Response: sensory/movement Comments: patient tolerated well  Preformed By: Raylene Miyamoto Deborra Medina)  Assistant: Maxine Glenn  Follow Up: weekly   Patient's daughter expressed frustration that she expressed to [place] that she felt like her mother was having an UTI and we did not address it and they ended up in the ED.   I reviewed the chart and her last appointment with [place] was on 04/10/2018 for a PTNS.  At that visit, she stated she was having issues with night time incontinence.  She did not express any symptoms of gross hematuria, pain with urination or suprapubic/low back pain.  She also did not express having any fevers, chills, nausea or vomiting.     According to the ED note from [place], she was seen on 04/18/2018 after having 2 to 3 days of confusion and generalized weakness.  The morning of the ED visit, Mrs. Badilla stated her BP was elevated and she took two or three extra BP pills plus a nitroglycerin.  (see note dated 04/18/2018).  She had a head CT which had no evidence of mass, hemorrhage, or definite acute infarction.  She was not tachycardic, hypotensive or febrile in the ED.  Her WBC count was normal at 9.0.  Daughter states they cathed her in the ED and her UA was positive for 46 WBC's and > 50 bacteria.   The urine culture grew out E. Coli resistant to ampicillin, ampicillin/sulbactam, cefazolin and ciprofloxacin.   She was given a shot of Rocephin in the ED and start on cefuroxime 250 mg BID x 5 days.     I expressed my concern regarding the possibility of an UTI being the cause of her generalized weakness and confusion as she was not having fever, no leukocytosis or  dysuria associated with her other symptoms.   I suggested that she and her daughter continue to be evaluated for other causes of her generalized weakness and confusion.    Mrs. Heffernan is quite tearful and states she just does not feel like doing anything.   She wishes she could still do the things like she did before like shopping and going out to eat.  She states she feels "weak" all the time.  She states this started with her UTI in November.   She also mentioned that being on antibiotics makes her feel bad too.    I recommended that she continue to increase her water intake which she states she is trying to do, she states she is taking her cranberry pills but she is not taking a probiotic.  She is also applying the vaginal estrogen cream 3 nights weekly.     I have made a recommendation to be evaluated for depression as she continues to states she doesn't feel like doing anything and is quite tearful.  She was very resistant to a referral to a psychiatrist.   I also recommended that she schedule an appointment with her gerontologist to see if some of her medications could be stopped or changed.     She has not had a cystoscopy in several years per daughter, so we will schedule this for her.  I explained that the cystoscopy consists of passing a tube with  a lens up through their urethra and into their urinary bladder.   We will inject the urethra with a lidocaine gel prior to introducing the cystoscope to help with any discomfort during the procedure.   After the procedure, they might experience blood in the urine and discomfort with urination.  This will abate after the first few voids.  I have  encouraged the patient to increase water intake  during this time.    I have also recommended an ID referral to gather their opinion and if only to reiterate what we have discussed regarding UTI's vs colonization.    I spent 25 minutes in a face to face conversation with the patient and her daughter of which  greater than 50% was in counseling and coordination of care with the patient.

## 2018-05-01 ENCOUNTER — Ambulatory Visit (INDEPENDENT_AMBULATORY_CARE_PROVIDER_SITE_OTHER): Payer: Medicare Other | Admitting: Urology

## 2018-05-01 DIAGNOSIS — N3946 Mixed incontinence: Secondary | ICD-10-CM | POA: Diagnosis not present

## 2018-05-01 NOTE — Patient Instructions (Addendum)
Continue probiotic and cranberry tablets.     Patient Name:______________________________________  Tracking your bladder symptoms Sample: Day Daytime Voids Number of Accidents Nighttime voids Urgenty for the day (0-4) Comments  Mon IIII 4 I 1 II 2 2 0=none-4=severe I had more coffee than usual today.   Week Starting:____________________________________ Day Daytime Voids Number of Accidents Nighttime voids Urgency for the day (0-4) Comments                                                                        This week my symptoms were:  O much better  O better O the same O worse  Week Starting:____________________________________ Day Daytime Voids Number of Accidents Nighttime voids Urgency for the day (0-4) Comments                                                                        This week my symptoms were:  O much better  O better O the same O worse

## 2018-05-05 NOTE — Progress Notes (Deleted)
PTNS  Session # 11  Health & Social Factors: *** Caffeine: *** Alcohol: *** Daytime voids #per day: *** Night-time voids #per night: *** Urgency: *** Incontinence Episodes #per day: *** Ankle used: *** Treatment Setting: *** Feeling/ Response: *** Comments: ***  Preformed By: ***  Assistant: ***  Follow Up: ***

## 2018-05-08 ENCOUNTER — Ambulatory Visit (INDEPENDENT_AMBULATORY_CARE_PROVIDER_SITE_OTHER): Payer: Medicare Other | Admitting: Urology

## 2018-05-08 DIAGNOSIS — N3946 Mixed incontinence: Secondary | ICD-10-CM | POA: Diagnosis not present

## 2018-05-08 NOTE — Progress Notes (Signed)
PTNS  Session # 12  Health & Social Factors: no change Caffeine: coffee half cup Alcohol: none Daytime voids #per day: 12 Night-time voids #per night: 2 Urgency: strong Incontinence Episodes #per day: 2-3 Ankle used: left Treatment Setting: 11 Feeling/ Response: 11 Comments: patient tolerated well  Preformed By: Jonnie Finner  Assistant: Elizabeth Palau  Follow Up: 05/15/2018

## 2018-05-10 ENCOUNTER — Encounter: Payer: Self-pay | Admitting: Urology

## 2018-05-10 ENCOUNTER — Ambulatory Visit: Payer: Medicare Other | Admitting: Urology

## 2018-05-10 VITALS — BP 201/101 | HR 86 | Ht 66.0 in | Wt 144.6 lb

## 2018-05-10 DIAGNOSIS — N3946 Mixed incontinence: Secondary | ICD-10-CM | POA: Diagnosis not present

## 2018-05-10 DIAGNOSIS — N390 Urinary tract infection, site not specified: Secondary | ICD-10-CM

## 2018-05-10 LAB — MICROSCOPIC EXAMINATION: RBC, UA: NONE SEEN /hpf (ref 0–2)

## 2018-05-10 LAB — URINALYSIS, COMPLETE
Bilirubin, UA: NEGATIVE
GLUCOSE, UA: NEGATIVE
KETONES UA: NEGATIVE
Nitrite, UA: NEGATIVE
PH UA: 6 (ref 5.0–7.5)
PROTEIN UA: NEGATIVE
RBC UA: NEGATIVE
Specific Gravity, UA: 1.01 (ref 1.005–1.030)
Urobilinogen, Ur: 0.2 mg/dL (ref 0.2–1.0)

## 2018-05-10 NOTE — Progress Notes (Signed)
In and Out Catheterization  Patient is present today for a I & O catheterization due to clean catch. Patient was cleaned and prepped in a sterile fashion with betadine and Lidocaine 2% jelly was instilled into the urethra.  A 14FR cath was inserted no complications were noted , 212ml of urine return was noted, urine was yellow in color. A clean urine sample was collected for UA. Bladder was drained  And catheter was removed with out difficulty.    Preformed by: Elberta Leatherwood, CMA

## 2018-05-10 NOTE — Progress Notes (Signed)
   05/10/2018   CC:  Chief Complaint  Patient presents with  . Cysto   HPI: Christine Mclean is a 85 y.o. female with a history of recurrent UTIs, vaginal atrophy, nocturia and a history of urinary retention who presents today for a cystoscopy due to recurrent UTIs.  Notably, she does have severe urge continence improved with PTNS.  Patient was given a one-time dose of Cipro to prevent infection.  Blood pressure (!) 201/101, pulse 86, height 5\' 6"  (1.676 m), weight 144 lb 9.6 oz (65.6 kg). NED. A&Ox3.   No respiratory distress   Abd soft, NT, ND Mildly atrophic genital mucosa with little bit narrow introitus and some labial irritation with patent urethral meatus  Cystoscopy Procedure Note  Patient identification was confirmed, informed consent was obtained, and patient was prepped using Betadine solution.  Lidocaine jelly was administered per urethral meatus.    Procedure: - Flexible cystoscope introduced, without any difficulty.   - Thorough search of the bladder revealed:    normal urethral meatus    normal urothelium    no stones    no ulcers     no tumors    no urethral polyps    mild trabeculation with some cobblestoning on the trigon3  - Ureteral orifices were normal in position and appearance.  Post-Procedure: - Patient tolerated the procedure well  Assessment/Plan:  1. Recurrent UTIs - Continue regimen as per Shannon's last note--vaginal estrogen cream, cranberry pills, PTNS - Return for catherized urine culture if she has signs or symptoms of infection  2. OAB - Return for maintanance PTNS once a month  Notably her BP was elevated today, has not yet taken BP meds and is symptomatic.  She has already called her PCP to address.    Hollice Espy, MD

## 2018-05-15 ENCOUNTER — Ambulatory Visit (INDEPENDENT_AMBULATORY_CARE_PROVIDER_SITE_OTHER): Payer: Medicare Other | Admitting: Urology

## 2018-05-15 DIAGNOSIS — R339 Retention of urine, unspecified: Secondary | ICD-10-CM | POA: Diagnosis not present

## 2018-05-15 DIAGNOSIS — N3946 Mixed incontinence: Secondary | ICD-10-CM

## 2018-05-15 NOTE — Progress Notes (Signed)
PTNS  Session # 11  Health & Social Factors: no change Caffeine: I cup daily Alcohol: none Daytime voids #per day: 6-8  Night-time voids #per night: 4 Urgency: strong Incontinence Episodes #per day: 5-6 Ankle used: right Treatment Setting: 8 Feeling/ Response: 8 Comments: Tolerated well  Preformed By: Jonnie Finner  Assistant: Elizabeth Palau  Follow Up: one week

## 2018-05-15 NOTE — Patient Instructions (Signed)
Patient Name:______________________________________  Tracking your bladder symptoms Sample: Day Daytime Voids Number of Accidents Nighttime voids Urgenty for the day (0-4) Comments  Mon IIII 4 I 1 II 2 2 0=none-4=severe I had more coffee than usual today.   Week Starting:____________________________________ Day Daytime Voids Number of Accidents Nighttime voids Urgency for the day (0-4) Comments                                                                        This week my symptoms were:  O much better  O better O the same O worse  Week Starting:____________________________________ Day Daytime Voids Number of Accidents Nighttime voids Urgency for the day (0-4) Comments                                                                        This week my symptoms were:  O much better  O better O the same O worse

## 2018-05-22 ENCOUNTER — Ambulatory Visit: Payer: Medicare Other | Admitting: Urology

## 2018-05-29 ENCOUNTER — Ambulatory Visit (INDEPENDENT_AMBULATORY_CARE_PROVIDER_SITE_OTHER): Payer: Medicare Other | Admitting: Urology

## 2018-05-29 DIAGNOSIS — N3946 Mixed incontinence: Secondary | ICD-10-CM | POA: Diagnosis not present

## 2018-05-29 NOTE — Patient Instructions (Addendum)
one month PTNS with Larene Beach.

## 2018-05-29 NOTE — Progress Notes (Signed)
Session # 12  Health & Social Factors: no change Caffeine: I cup daily Alcohol: none Daytime voids #per day: 6-8  Night-time voids #per night: 4 Urgency: strong Incontinence Episodes #per day: 5-6 Ankle used: right Treatment Setting: 8 Feeling/ Response: 8 Comments: Tolerated well  Preformed QI:[id number] McGowan   Assistant:Jessica Qualls CMA  Follow Up: one month PTNS with Larene Beach.

## 2018-06-12 ENCOUNTER — Encounter: Payer: Self-pay | Admitting: Urology

## 2018-06-12 ENCOUNTER — Telehealth: Payer: Self-pay

## 2018-06-12 ENCOUNTER — Other Ambulatory Visit: Payer: Self-pay

## 2018-06-12 ENCOUNTER — Other Ambulatory Visit
Admission: RE | Admit: 2018-06-12 | Discharge: 2018-06-12 | Disposition: A | Discharge status: Home / Self Care | Payer: Medicare Other | Attending: Urology | Admitting: Urology

## 2018-06-12 ENCOUNTER — Ambulatory Visit (INDEPENDENT_AMBULATORY_CARE_PROVIDER_SITE_OTHER): Payer: Medicare Other | Admitting: Urology

## 2018-06-12 VITALS — BP 153/84 | HR 82 | Temp 97.8°F | Resp 16 | Ht 66.0 in | Wt 143.0 lb

## 2018-06-12 DIAGNOSIS — Z87440 Personal history of urinary (tract) infections: Secondary | ICD-10-CM | POA: Diagnosis present

## 2018-06-12 DIAGNOSIS — R350 Frequency of micturition: Secondary | ICD-10-CM

## 2018-06-12 LAB — URINALYSIS, COMPLETE (UACMP) WITH MICROSCOPIC
Bilirubin Urine: NEGATIVE
Glucose, UA: NEGATIVE mg/dL
Hgb urine dipstick: NEGATIVE
KETONES UR: NEGATIVE mg/dL
Nitrite: POSITIVE — AB
Protein, ur: NEGATIVE mg/dL
RBC / HPF: NONE SEEN RBC/hpf (ref 0–5)
SPECIFIC GRAVITY, URINE: 1.015 (ref 1.005–1.030)
Squamous Epithelial / HPF: NONE SEEN (ref 0–5)
WBC, UA: >50 WBC/hpf (ref 0–5)
pH: 7 (ref 5.0–8.0)

## 2018-06-12 NOTE — Patient Instructions (Addendum)
  Patient Name:______________________________________  Tracking your bladder symptoms Sample: Day Daytime Voids Number of Accidents Nighttime voids Urgenty for the day (0-4) Comments  Mon IIII 4 I 1 II 2 2 0=none-4=severe I had more coffee than usual today.   Week Starting:____________________________________ Day Daytime Voids Number of Accidents Nighttime voids Urgency for the day (0-4) Comments                                                                        This week my symptoms were:  O much better  O better O the same O worse  Week Starting:____________________________________ Day Daytime Voids Number of Accidents Nighttime voids Urgency for the day (0-4) Comments                                                                        This week my symptoms were:  O much better  O better O the same O worse

## 2018-06-12 NOTE — Progress Notes (Signed)
06/12/2018 3:14 PM   Christine Mclean October 09, 1926 [id number]  Referring provider: Frederick Peers, MD [address] [place], [place] [address]  Chief Complaint  Patient presents with  . Follow-up    UA Cath    HPI: Patient is an 85 year old Caucasian female with a history of rUTI's, vaginal atrophy, nocturia and a history of urinary retention who presents today with her daughter, [name], for a possible UTI.  rUTI's Risk factors: age, vaginal atrophy, dementia, constipation and incontinence.  RUS in 09/2015 was normal ultrasound of the kidneys and bladder, with no hydronephrosis.  Cysto with Dr. Erlene Quan on 05/10/2018 normal.    Today, she is complaining of frequency, nocturia and incontinence.  She states it is always like this.  Her daughter states she is having memory issues and is fearful she become septic and end up in the hospital.  Patient denies any gross hematuria, dysuria or suprapubic/flank pain.  Patient denies any fevers, chills, nausea or vomiting.      PMH: Past Medical History:  Diagnosis Date  . Arthritis   . Back pain   . Heart disease   . Heart failure ([place])   . HLD (hyperlipidemia)   . HTN (hypertension)   . Recurrent UTI   . Stroke (cerebrum) ([facility])   . Uterine cancer [facility])     Surgical History: Past Surgical History:  Procedure Laterality Date  . ABDOMINAL HYSTERECTOMY  1972  . APPENDECTOMY  1950  . BACK SURGERY  2016  . SKIN CANCER DESTRUCTION  2016  . TOTAL HIP ARTHROPLASTY Bilateral    done seperately    Home Medications:  Allergies as of 06/12/2018      Reactions   Sulfa Antibiotics Nausea And Vomiting      Medication List       Accurate as of June 12, 2018  3:14 PM. Always use your most recent med list.        acetaminophen 500 MG tablet Commonly known as:  TYLENOL Take by mouth.   amLODipine 5 MG tablet Commonly known as:  NORVASC Take by mouth.   aspirin EC 81 MG tablet Take by mouth.   CALCIUM  600+D PLUS MINERALS 600-400 MG-UNIT Tabs Take by mouth.   D 2000 50 MCG (2000 UT) Tabs Generic drug:  Cholecalciferol Take by mouth.   diclofenac sodium 1 % Gel Commonly known as:  VOLTAREN diclofenac 1 % topical gel   famotidine 20 MG tablet Commonly known as:  PEPCID famotidine 20 mg tablet   ferrous sulfate 325 (65 FE) MG tablet Take by mouth.   furosemide 20 MG tablet Commonly known as:  LASIX Take 10 mg by mouth.   HYDROcodone-acetaminophen 5-325 MG tablet Commonly known as:  NORCO/VICODIN Take by mouth. Reported on 10/07/2015   losartan 25 MG tablet Commonly known as:  COZAAR Take by mouth.   MULTI-VITAMINS Tabs Take by mouth.   NITROSTAT 0.4 MG SL tablet Generic drug:  nitroGLYCERIN PLACE 1 TABLET  UNDER THE TONGUE EVERY 5 (FIVE) MINUTES AS NEEDED FOR CHEST PAIN. MAY TAKE UP TO 3 DOSES.   polyethylene glycol packet Commonly known as:  MIRALAX / GLYCOLAX Take by mouth.   RA VITAMIN B-12 TR 1000 MCG Tbcr Generic drug:  Cyanocobalamin Take by mouth.       Allergies:  Allergies  Allergen Reactions  . Sulfa Antibiotics Nausea And Vomiting    Family History: Family History  Problem Relation Age of Onset  . Kidney disease  Neg Hx   . Bladder Cancer Neg Hx   . Kidney cancer Neg Hx   . Prostate cancer Neg Hx     Social History:  reports that she has never smoked. She has never used smokeless tobacco. She reports that she does not drink alcohol or use drugs.  ROS: UROLOGY Frequent Urination?: Yes Burning/pain with urination?: No Get up at night to urinate?: Yes Leakage of urine?: Yes Trouble starting stream?: No Do you have to strain to urinate?: No Blood in urine?: No Urinary tract infection?: Yes Sexually transmitted disease?: No Injury to kidneys or bladder?: No Painful intercourse?: No Weak stream?: No Currently pregnant?: No Last menstrual period?: n  Gastrointestinal Nausea?: No Vomiting?: No Indigestion/heartburn?: No Diarrhea?:  Yes Constipation?: No  Constitutional Fever: No Night sweats?: No Weight loss?: No Fatigue?: Yes  Skin Skin rash/lesions?: Yes Itching?: Yes  Eyes Blurred vision?: No Double vision?: No  Ears/Nose/Throat Sore throat?: No Sinus problems?: No  Hematologic/Lymphatic Swollen glands?: No Easy bruising?: No  Cardiovascular Leg swelling?: Yes Chest pain?: No  Respiratory Cough?: No Shortness of breath?: No  Endocrine Excessive thirst?: No  Musculoskeletal Back pain?: No Joint pain?: No  Neurological Headaches?: No Dizziness?: Yes  Psychologic Depression?: No Anxiety?: Yes  Physical Exam: BP (!) 153/84   Pulse 82   Temp 97.8 F (36.6 C) (Oral)   Resp 16   Ht 5\' 6"  (1.676 m)   Wt 143 lb (64.9 kg)   BMI 23.08 kg/m   Constitutional:  Well nourished. Alert and oriented, No acute distress. HEENT: [place] AT, moist mucus membranes.  Trachea midline, no masses. Cardiovascular: No clubbing, cyanosis, or edema. Respiratory: Normal respiratory effort, no increased work of breathing. GI: Abdomen is soft, non tender, non distended, no abdominal masses. Liver and spleen not palpable.  No hernias appreciated.  Stool sample for occult testing is not indicated.   GU: No CVA tenderness.  No bladder fullness or masses.  Atrophic external genitalia, sparse pubic hair  Distribution caked with barrier ointment "Butt paste", no lesions.  Normal urethral meatus, no lesions, no prolapse, no discharge.   No urethral masses, tenderness and/or tenderness. No bladder fullness, tenderness or masses. Erythematous vagina mucosa, poor estrogen effect, no discharge, no lesions.    Anus and perineum are without rashes or lesions.    Skin: No rashes, bruises or suspicious lesions. Lymph: No cervical or inguinal adenopathy. Neurologic: Grossly intact, no focal deficits, moving all 4 extremities. Psychiatric: Normal mood and affect.   Laboratory Data: Lab Results  Component Value Date   WBC  6.7 09/06/2013   HGB 11.1 (L) 09/06/2013   HCT 33.7 (L) 09/06/2013   MCV 92 09/06/2013   PLT 334 09/06/2013    Lab Results  Component Value Date   CREATININE 0.66 07/08/2016    No results found for: PSA  No results found for: TESTOSTERONE  No results found for: HGBA1C  Lab Results  Component Value Date   TSH 3.10 06/01/2012       Component Value Date/Time   CHOL 97 05/02/2012 0441   HDL 28 (L) 05/02/2012 0441   VLDL 21 05/02/2012 0441   LDLCALC 48 05/02/2012 0441    Lab Results  Component Value Date   AST 30 01/11/2013   Lab Results  Component Value Date   ALT 24 01/11/2013   No components found for: ALKALINEPHOPHATASE No components found for: BILIRUBINTOTAL  No results found for: ESTRADIOL  Urinalysis CATH UA nitrite positive, >50WBC's and many bacteria.  See Epic. I have reviewed the lab  In and Out Catheterization Patient is present today for a I & O catheterization due to possible UTI. Patient was cleaned and prepped in a sterile fashion with betadine and Lidocaine 2% jelly was instilled into the urethra.  A 14 FR cath was inserted no complications were noted , 100 ml of urine return was noted, urine was yellow in color. A clean urine sample was collected for UA and urine culture. Bladder was drained  And catheter was removed with out difficulty.    Preformed by: Zara Council, PA-C   Assessment & Plan:    1. Asymptomatic bacteriemia -discussed with the patient and her daughter that the recommendations from ID is not to screen for an UTI when mental status changes are present and to see if they improve on their own or to seek out other causes -explained that frequency, nocturia and incontinence without dysuria, hematuria, suprapubic/back pain/flank pain, fevers, chills, nausea or vomiting are not symptoms of an UTI -explained that prescribing antibiotics for a colonizations exposes her to more risk of developing a multi antibiotic resistant bacteria,  risk of C. Diff, risk of kidney/liver toxicity and risk of a severe allergic reaction to the antibiotic -daughter became upset as she feels we missed an UTI over the Christmas holiday in the ED and the ED treated her for the UTI - I explained that the ED physician noted that she was not toxic looking and her WBC count was normal and that my suspicions were she was colonized -daughter was not satisfied with that explanation and wanted [place] to proceed with the cath UA  -cath UA is positive for nitrites, > 50 WBC's and many bacteria - will send for culture, but hold on prescribing an antibiotic until she develops symptoms of an UTI    Return for pending urine culture results .  These notes generated with voice recognition software. I apologize for typographical errors.  Zara Council, PA-C  [facility]  [place], [place] [address] [phone]  I spent 15 minutes with this patient and daughter in a face to face visit of which greater than 50% was spent in counseling and coordination of care with the patient.

## 2018-06-12 NOTE — Progress Notes (Unsigned)
Error

## 2018-06-12 NOTE — Telephone Encounter (Signed)
Patient notified that UA will be sent for culture. If symptoms worsen please call office per [facility].

## 2018-06-13 ENCOUNTER — Telehealth: Payer: Self-pay

## 2018-06-13 NOTE — Telephone Encounter (Signed)
-----   Message from Nori Riis, PA-C sent at 06/12/2018  3:19 PM EST ----- Please let Mrs. Hoopes daughter, [name], know that urine has been sent for culture.  I would not recommend an antibiotic at this time unless her symptoms worsen.  Culture results should be available on Friday.

## 2018-06-13 NOTE — Telephone Encounter (Signed)
Mrs. Belflower was cathed yesterday for a specimen and her PVR was 100 cc.  I still think it is the best course of action to wait until the culture results are available unless she develops chills, fevers, suprapubic pain/back pain/flank pain, dysuria, hematuria and/or the inability to urinate and has a history of rUTI's with multiresistant organisms.

## 2018-06-13 NOTE — Telephone Encounter (Signed)
Called pt's daughter, she states that her mother is having more urinary urgency and also is not getting very much urine out when she goes. She wanted me to let you know this. I advised them that if sx worsen call back otherwise we will contact them with cx results when they arrive.

## 2018-06-15 ENCOUNTER — Telehealth: Payer: Self-pay

## 2018-06-15 LAB — URINE CULTURE: Culture: >=100000 — AB

## 2018-06-15 MED ORDER — NITROFURANTOIN MONOHYD MACRO 100 MG PO CAPS: 100.0000 mg | ORAL_CAPSULE | Freq: Two times a day (BID) | ORAL | 0 refills | Status: AC

## 2018-06-15 NOTE — Telephone Encounter (Signed)
-----   Message from Nori Riis, PA-C sent at 06/14/2018  7:51 AM EST ----- Please reach out to Mrs. Fosco daughter, [name], and see what symptoms she is experiencing regarding her urinary system.

## 2018-06-15 NOTE — Telephone Encounter (Signed)
Called and spoke with patients daughter. She states that patient is not doing well and is feeling very bad. Reports no fever but is still having urinary symptoms and is still weak. Requesting antibiotic be sent to [facility].

## 2018-06-15 NOTE — Telephone Encounter (Signed)
Macrobid sent to [place]

## 2018-06-15 NOTE — Telephone Encounter (Signed)
-----   Message from Nori Riis, PA-C sent at 06/14/2018  7:51 AM EST ----- Please reach out to Mrs. [place] daughter, [name], and see what symptoms she is experiencing regarding her urinary system.

## 2018-06-15 NOTE — Telephone Encounter (Signed)
-----   Message from Nori Riis, PA-C sent at 06/15/2018  8:32 AM EST ----- Please send in a prescription of Macrobid 100 mg, one capsule twice daily for seven days.

## 2018-07-03 ENCOUNTER — Ambulatory Visit: Payer: Medicare Other | Admitting: Urology

## 2020-10-24 DEATH — deceased
